# Patient Record
Sex: Female | Born: 1990 | Race: Black or African American | Hispanic: No | Marital: Single | Smoking: Current every day smoker
Health system: Southern US, Community
[De-identification: ages and names within clinical notes are randomized; demographics above are authoritative.]

## PROBLEM LIST (undated history)

## (undated) DIAGNOSIS — F1210 Cannabis abuse, uncomplicated: Secondary | ICD-10-CM

---
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
## 2012-06-27 ENCOUNTER — Emergency Department (HOSPITAL_BASED_OUTPATIENT_CLINIC_OR_DEPARTMENT_OTHER)
Admission: EM | Admit: 2012-06-27 | Discharge: 2012-06-27 | Disposition: A | Discharge status: Home / Self Care | Payer: Self-pay | Attending: Emergency Medicine | Admitting: Emergency Medicine

## 2012-06-27 ENCOUNTER — Encounter (HOSPITAL_BASED_OUTPATIENT_CLINIC_OR_DEPARTMENT_OTHER): Payer: Self-pay | Admitting: Emergency Medicine

## 2012-06-27 DIAGNOSIS — R21 Rash and other nonspecific skin eruption: Secondary | ICD-10-CM | POA: Insufficient documentation

## 2012-06-27 DIAGNOSIS — L240 Irritant contact dermatitis due to detergents: Secondary | ICD-10-CM | POA: Insufficient documentation

## 2012-06-27 MED ORDER — PREDNISONE 20 MG PO TABS: 60.0000 mg | ORAL_TABLET | Freq: Every day | ORAL | Status: DC

## 2012-06-27 MED ORDER — DIPHENHYDRAMINE HCL 25 MG PO CAPS
50.0000 mg | ORAL_CAPSULE | Freq: Once | ORAL | Status: AC
  Administered 2012-06-27: 50 mg via ORAL
  Filled 2012-06-27: qty 2

## 2012-06-27 MED ORDER — PREDNISONE 50 MG PO TABS
60.0000 mg | ORAL_TABLET | Freq: Once | ORAL | Status: AC
  Administered 2012-06-27: 60 mg via ORAL
  Filled 2012-06-27: qty 1

## 2012-06-27 NOTE — ED Notes (Signed)
Pt has generalized fine rash with severe itching.  Pt states it started yesterday.  Pt states she changed laundry detergents.

## 2012-06-27 NOTE — ED Provider Notes (Signed)
History    This chart was scribed for Vinayak Bobier B. Bernette Mayers, MD by Quintella Reichert, ED scribe.  This patient was seen in room MH12/MH12 and the patient's care was started at 8:37 PM.   CSN: [id number]  Arrival date & time 06/27/12  1832       Chief Complaint  Patient presents with  . Pruritis     The history is provided by the patient. No language interpreter was used.    HPI Comments: Megan George is a 22 y.o. female who presents to the Emergency Department complaining of generalized itchy rash that began yesterday.  Pt states that she initially thought that rash was due to mosquito bites but that they rapidly became more extensive.  She states that the majority are on her back, but many are also on her abdomen, legs and arms.  She notes that she recently changed laundry detergent.  Pt denies SOB, swelling, fever, or any other associated symptoms.  She denies h/o any other medical problems.  No past medical history on file.  No past surgical history on file.  Family History  Problem Relation Age of Onset  . Sudden death Neg Hx   . Heart attack Neg Hx     History  Substance Use Topics  . Smoking status: Never Smoker   . Smokeless tobacco: Not on file  . Alcohol Use: Not on file    OB History   Grav Para Term Preterm Abortions TAB SAB Ect Mult Living                  Review of Systems A complete 10 system review of systems was obtained and all systems are negative except as noted in the HPI and PMH.    Allergies  Review of patient's allergies indicates no known allergies.  Home Medications   Current Outpatient Rx  Name  Route  Sig  Dispense  Refill  . predniSONE (DELTASONE) 20 MG tablet   Oral   Take 3 tablets (60 mg total) by mouth daily.   15 tablet   0     BP 132/89  Pulse 78  Temp(Src) 98.2 F (36.8 C) (Oral)  Resp 16  Ht 5\' 6"  (1.676 m)  Wt 140 lb (63.504 kg)  BMI 22.61 kg/m2  SpO2 100%  LMP 06/08/2012  Physical Exam  Nursing note and  vitals reviewed. Constitutional: She is oriented to person, place, and time. She appears well-developed and well-nourished.  HENT:  Head: Normocephalic and atraumatic.  Eyes: EOM are normal. Pupils are equal, round, and reactive to light.  Neck: Normal range of motion. Neck supple.  Cardiovascular: Normal rate, normal heart sounds and intact distal pulses.   No murmur heard. Pulmonary/Chest: Effort normal and breath sounds normal. No stridor. She has no wheezes. She has no rales.  Abdominal: Bowel sounds are normal. She exhibits no distension. There is no tenderness.  Musculoskeletal: Normal range of motion. She exhibits no edema and no tenderness.  Neurological: She is alert and oriented to person, place, and time. She has normal strength. No cranial nerve deficit or sensory deficit.  Skin: Skin is warm and dry. Rash (Diffuse pruritic rash to arms, legs, and trunk) noted.  Psychiatric: She has a normal mood and affect.    ED Course  Procedures (including critical care time)  DIAGNOSTIC STUDIES: Oxygen Saturation is 100% on room air, normal by my interpretation.    COORDINATION OF CARE: 8:40 PM-Informed pt that rash is likely contact dermatitis caused  by detergent.  Discussed home treatment plan which includes oral prednisone with pt at bedside and pt agreed to plan.      Labs Reviewed - No data to display No results found.   1. Contact dermatitis and eczema due to detergents       MDM  Pt with extensive contact dermatitis, given oral prednisone. Benadryl for itching.       I personally performed the services described in this documentation, which was scribed in my presence. The recorded information has been reviewed and is accurate.     Tylee Yum B. Bernette Mayers, MD 06/27/12 [phone]

## 2012-09-11 ENCOUNTER — Ambulatory Visit: Payer: Self-pay | Admitting: Family Medicine

## 2012-12-07 ENCOUNTER — Encounter (HOSPITAL_BASED_OUTPATIENT_CLINIC_OR_DEPARTMENT_OTHER): Payer: Self-pay | Admitting: Emergency Medicine

## 2012-12-07 ENCOUNTER — Emergency Department (HOSPITAL_BASED_OUTPATIENT_CLINIC_OR_DEPARTMENT_OTHER)
Admission: EM | Admit: 2012-12-07 | Discharge: 2012-12-07 | Disposition: A | Discharge status: Home / Self Care | Payer: Self-pay | Attending: Emergency Medicine | Admitting: Emergency Medicine

## 2012-12-07 DIAGNOSIS — K1370 Unspecified lesions of oral mucosa: Secondary | ICD-10-CM | POA: Insufficient documentation

## 2012-12-07 DIAGNOSIS — IMO0002 Reserved for concepts with insufficient information to code with codable children: Secondary | ICD-10-CM | POA: Insufficient documentation

## 2012-12-07 DIAGNOSIS — F17200 Nicotine dependence, unspecified, uncomplicated: Secondary | ICD-10-CM | POA: Insufficient documentation

## 2012-12-07 DIAGNOSIS — K121 Other forms of stomatitis: Secondary | ICD-10-CM

## 2012-12-07 DIAGNOSIS — Z79899 Other long term (current) drug therapy: Secondary | ICD-10-CM | POA: Insufficient documentation

## 2012-12-07 MED ORDER — ACYCLOVIR 400 MG PO TABS: 400.0000 mg | ORAL_TABLET | Freq: Four times a day (QID) | ORAL | Status: DC

## 2012-12-07 NOTE — ED Provider Notes (Signed)
CSN: [id number]     Arrival date & time 12/07/12  1022 History   First MD Initiated Contact with Patient 12/07/12 1127     Chief Complaint  Patient presents with  . bump to chin    (Consider location/radiation/quality/duration/timing/severity/associated sxs/prior Treatment) Patient is a 22 y.o. female presenting with mouth injury. The history is provided by the patient. No language interpreter was used.  Mouth Injury This is a new problem. The current episode started today. The problem occurs constantly. The problem has been unchanged. Nothing aggravates the symptoms. She has tried nothing for the symptoms. The treatment provided no relief.  Pt complains of a ulcer to lip and a lymph node under  History reviewed. No pertinent past medical history. History reviewed. No pertinent past surgical history. Family History  Problem Relation Age of Onset  . Sudden death Neg Hx   . Heart attack Neg Hx    History  Substance Use Topics  . Smoking status: Current Some Day Smoker  . Smokeless tobacco: Not on file  . Alcohol Use: Not on file   OB History   Grav Para Term Preterm Abortions TAB SAB Ect Mult Living                 Review of Systems  Skin: Positive for wound.  All other systems reviewed and are negative.    Allergies  Review of patient's allergies indicates no known allergies.  Home Medications   Current Outpatient Rx  Name  Route  Sig  Dispense  Refill  . acyclovir (ZOVIRAX) 400 MG tablet   Oral   Take 1 tablet (400 mg total) by mouth 4 (four) times daily.   50 tablet   0   . predniSONE (DELTASONE) 20 MG tablet   Oral   Take 3 tablets (60 mg total) by mouth daily.   15 tablet   0    BP 135/73  Pulse 77  Temp(Src) 99.3 F (37.4 C) (Oral)  Resp 16  Ht 5\' 6"  (1.676 m)  Wt 140 lb (63.504 kg)  BMI 22.61 kg/m2  SpO2 100%  LMP 12/07/2012 Physical Exam  Nursing note and vitals reviewed. Constitutional: She is oriented to person, place, and time. She  appears well-developed and well-nourished.  HENT:  Head: Normocephalic and atraumatic.  Ulcer lower outer lip  Cardiovascular: Normal rate.   Pulmonary/Chest: Effort normal.  Neurological: She is alert and oriented to person, place, and time. She has normal reflexes.  Skin: Skin is warm.  Psychiatric: She has a normal mood and affect.    ED Course  Procedures (including critical care time) Labs Review Labs Reviewed - No data to display Imaging Review No results found.  EKG Interpretation   None       MDM   1. Oral ulcer    acyclovir    Elson Areas, PA-C 12/07/12 1152

## 2012-12-07 NOTE — ED Notes (Signed)
Pt amb to room 6 with quick steady gait in nad. Pt reports fever blister onset Friday, had some "tingling" to her chin, and today a "bump came on". No redness noted.

## 2012-12-09 NOTE — ED Provider Notes (Signed)
History/physical exam/procedure(s) were performed by non-physician practitioner and as supervising physician I was immediately available for consultation/collaboration. I have reviewed all notes and am in agreement with care and plan.   Mafalda Mcginniss S Dacoda Finlay, MD 12/09/12 1506 

## 2013-11-24 ENCOUNTER — Ambulatory Visit (INDEPENDENT_AMBULATORY_CARE_PROVIDER_SITE_OTHER): Payer: Self-pay | Admitting: Family Medicine

## 2013-11-24 ENCOUNTER — Encounter: Payer: Self-pay | Admitting: Family Medicine

## 2013-11-24 VITALS — BP 122/84 | HR 93 | Ht 65.0 in | Wt 133.2 lb

## 2013-11-24 DIAGNOSIS — Z025 Encounter for examination for participation in sport: Secondary | ICD-10-CM

## 2013-11-24 NOTE — Progress Notes (Signed)
Patient ID: Megan George, female   DOB: 05/18/1990, 23 y.o.   MRN: [id number]  Patient is a [age] y.o. year old female here for sports physical.  Patient plans to play basketball. Reports no current complaints. Denies chest pain, shortness of breath, passing out with exercise. No medical problems. No family history of heart disease or sudden death before age 21.  Vision 20/25 right, 20/20 left without correction Blood pressure normal for age and height  History of irregular heartbeat - had EKG that was normal back in 2011 - no recurrence and not been on any medicines.  History of 3 concussions but no sequelae - last one 4 years ago. History of ankle sprains but denies any injuries since last sports physical here 2 years ago.  History reviewed. No pertinent past medical history.  No current outpatient prescriptions on file prior to visit.   No current facility-administered medications on file prior to visit.    History reviewed. No pertinent past surgical history.  No Known Allergies  History   Social History  . Marital Status: Single    Spouse Name: N/A    Number of Children: N/A  . Years of Education: N/A   Occupational History  . Not on file.   Social History Main Topics  . Smoking status: Current Some Day Smoker  . Smokeless tobacco: Not on file  . Alcohol Use: Not on file  . Drug Use: Not on file  . Sexual Activity: Not on file   Other Topics Concern  . Not on file   Social History Narrative  . No narrative on file    Family History  Problem Relation Age of Onset  . Sudden death Neg Hx   . Heart attack Neg Hx     BP 122/84  Pulse 93  Ht 5\' 5"  (1.651 m)  Wt 133 lb 3.2 oz (60.419 kg)  BMI 22.17 kg/m2  Review of Systems: See HPI above.  Physical Exam: Gen: NAD CV: RRR no MRG Lungs: CTAB MSK: FROM and strength all joints and muscle groups.  No evidence scoliosis.  Assessment/Plan: 1. Sports physical: Cleared for all sports without restrictions.

## 2013-11-24 NOTE — Assessment & Plan Note (Signed)
Cleared for all sports without restrictions. 

## 2016-06-21 ENCOUNTER — Encounter (HOSPITAL_BASED_OUTPATIENT_CLINIC_OR_DEPARTMENT_OTHER): Payer: Self-pay | Admitting: *Deleted

## 2016-06-21 ENCOUNTER — Emergency Department (HOSPITAL_BASED_OUTPATIENT_CLINIC_OR_DEPARTMENT_OTHER)
Admission: EM | Admit: 2016-06-21 | Discharge: 2016-06-21 | Disposition: A | Discharge status: Home / Self Care | Payer: Self-pay | Attending: Emergency Medicine | Admitting: Emergency Medicine

## 2016-06-21 DIAGNOSIS — F17200 Nicotine dependence, unspecified, uncomplicated: Secondary | ICD-10-CM | POA: Insufficient documentation

## 2016-06-21 DIAGNOSIS — X158XXA Contact with other hot household appliances, initial encounter: Secondary | ICD-10-CM | POA: Insufficient documentation

## 2016-06-21 DIAGNOSIS — T22232A Burn of second degree of left upper arm, initial encounter: Secondary | ICD-10-CM | POA: Insufficient documentation

## 2016-06-21 DIAGNOSIS — Y990 Civilian activity done for income or pay: Secondary | ICD-10-CM | POA: Insufficient documentation

## 2016-06-21 DIAGNOSIS — Y929 Unspecified place or not applicable: Secondary | ICD-10-CM | POA: Insufficient documentation

## 2016-06-21 DIAGNOSIS — Y9389 Activity, other specified: Secondary | ICD-10-CM | POA: Insufficient documentation

## 2016-06-21 MED ORDER — SILVER SULFADIAZINE 1 % EX CREA: 1.0000 "application " | TOPICAL_CREAM | Freq: Every day | CUTANEOUS | 0 refills | Status: DC

## 2016-06-21 MED ORDER — SILVER SULFADIAZINE 1 % EX CREA
TOPICAL_CREAM | CUTANEOUS | Status: AC
  Filled 2016-06-21: qty 85

## 2016-06-21 NOTE — ED Notes (Signed)
ED Provider at bedside. 

## 2016-06-21 NOTE — Discharge Instructions (Signed)
Apply Silvadene cream daily. Change dressing daily. Follow up with primary care for reevaluation in one week. Return to the ED if any concerning symptoms or worsening develops.

## 2016-06-21 NOTE — ED Provider Notes (Signed)
[facility]-EMERGENCY DEPT Provider Note   CSN: [id number] Arrival date & time: 06/21/16  2014   By signing my name below, I, Soijett Blue, attest that this documentation has been prepared under the direction and in the presence of Michela PitcherMina Alexandro Line, PA-C Electronically Signed: Soijett Blue, ED Scribe. 06/21/16. 8:39 PM.  History   Chief Complaint Chief Complaint  Patient presents with  . Burn    HPI Megan George is a 26 y.o. female who presents to the Emergency Department complaining of left upper arm burn onset 3:30 PM today. Pt reports that she was at work at [employer] when she accidentally leaned against a quesadilla cooker for 2-3 seconds. Pt notes that she took a shower PTA and noted blisters to the affected area following the use of Jergens lotion. She has not tried any medications for the relief of her symptoms. She states the area is not painful. She denies numbness, tingling, weakness, CP, SOB, abdominal pain, nausea, vomiting, and any other symptoms.     The history is provided by the patient. No language interpreter was used.    History reviewed. No pertinent past medical history.  Patient Active Problem List   Diagnosis Date Noted  . Sports physical 08/29/2011  . FOOT PAIN, RIGHT 01/30/2010    History reviewed. No pertinent surgical history.  OB History    No data available       Home Medications    Prior to Admission medications   Medication Sig Start Date End Date Taking? Authorizing Provider  silver sulfADIAZINE (SILVADENE) 1 % cream Apply 1 application topically daily. 06/21/16   Jeanie SewerFawze, Lanitra Battaglini A, PA-C    Family History Family History  Problem Relation Age of Onset  . Sudden death Neg Hx   . Heart attack Neg Hx     Social History Social History  Substance Use Topics  . Smoking status: Current Some Day Smoker    Packs/day: 0.50  . Smokeless tobacco: Not on file  . Alcohol use No     Allergies   Patient has no known allergies.   Review of Systems Review  of Systems  Respiratory: Negative for shortness of breath.   Cardiovascular: Negative for chest pain.  Gastrointestinal: Negative for abdominal pain, nausea and vomiting.  Skin:       +Left upper arm burn  Neurological: Negative for weakness and numbness.       No tingling     Physical Exam Updated Vital Signs BP 131/84   Pulse 81   Temp 98.4 F (36.9 C)   Resp 16   Ht 5\' 6"  (1.676 m)   Wt 120 lb (54.4 kg)   LMP 05/29/2016   SpO2 100%   BMI 19.37 kg/m   Physical Exam  Constitutional: She is oriented to person, place, and time. She appears well-developed and well-nourished. No distress.  HENT:  Head: Normocephalic and atraumatic.  Eyes: EOM are normal.  Neck: Neck supple.  Cardiovascular: Normal rate.   Pulses:      Radial pulses are 2+ on the right side, and 2+ on the left side.  Pulmonary/Chest: Effort normal. No respiratory distress.  Abdominal: She exhibits no distension.  Musculoskeletal: Normal range of motion.  Neurological: She is alert and oriented to person, place, and time.  Fluent speech, no facial droop, sensation intact globally.   Skin: Skin is warm and dry. Burn noted.  4 cm x 5 cm square area of burn with superficial skin layers removed post debridement by nurse to the  lateral aspect of mid left upper arm. no blistering or bleeding. Non-TTP. The inferior portion of the burn is is pink with superior area slightly more paler in appearance. Overlying tattoo still clearly visible. No visible subcutaneous fat or deep tissues/structures.    Psychiatric: She has a normal mood and affect. Her behavior is normal.  Nursing note and vitals reviewed.    ED Treatments / Results  DIAGNOSTIC STUDIES: Oxygen Saturation is 100% on RA, nl by my interpretation.    COORDINATION OF CARE: 8:35 PM Discussed treatment plan with pt at bedside which includes consult with attending, and pt agreed to plan.   Labs (all labs ordered are listed, but only abnormal results are  displayed) Labs Reviewed - No data to display  EKG  EKG Interpretation None       Radiology No results found.  Procedures Procedures (including critical care time)  Medications Ordered in ED Medications - No data to display   Initial Impression / Assessment and Plan / ED Course  I have reviewed the triage vital signs and the nursing notes.  Pertinent labs & imaging results that were available during my care of the patient were reviewed by me and considered in my medical decision making (see chart for details).     Patient with nontender partial-thickness burn of the left upper arm. Patient afebrile, vital signs are stable, sensation is intact. No evidence of extension into the deep structures of the arm. Burn has been debrided by nurse with application of Silvadene cream and dressing. Discussed proper wound care, recommend follow-up with primary care this week for reevaluation. Patient will be discharged with Silvadene cream and dressing. Instructed to follow-up with burn clinic if any concerning symptoms develop, or return to the ED. Patient is in no apparent distress at time of discharge and verbalizes understanding of and agreement with plan.   Final Clinical Impressions(s) / ED Diagnoses   Final diagnoses:  Partial thickness burn of left upper arm, initial encounter    New Prescriptions Discharge Medication List as of 06/21/2016  9:23 PM    START taking these medications   Details  silver sulfADIAZINE (SILVADENE) 1 % cream Apply 1 application topically daily., Starting Fri 06/21/2016, Print       I personally performed the services described in this documentation, which was scribed in my presence. The recorded information has been reviewed and is accurate.     Jeanie Sewer, PA-C 06/22/16 1720    Nira Conn, MD 06/25/16 [phone]

## 2016-06-21 NOTE — ED Notes (Signed)
Burn dsg to left upper arm

## 2016-06-21 NOTE — ED Notes (Signed)
Burn debrided blister removed nickel size with area to center of burn with pink skin surrounding

## 2016-06-23 ENCOUNTER — Encounter (HOSPITAL_BASED_OUTPATIENT_CLINIC_OR_DEPARTMENT_OTHER): Payer: Self-pay | Admitting: Emergency Medicine

## 2016-06-23 ENCOUNTER — Emergency Department (HOSPITAL_BASED_OUTPATIENT_CLINIC_OR_DEPARTMENT_OTHER)
Admission: EM | Admit: 2016-06-23 | Discharge: 2016-06-23 | Disposition: A | Discharge status: Home / Self Care | Payer: Self-pay | Attending: Emergency Medicine | Admitting: Emergency Medicine

## 2016-06-23 DIAGNOSIS — X19XXXD Contact with other heat and hot substances, subsequent encounter: Secondary | ICD-10-CM | POA: Insufficient documentation

## 2016-06-23 DIAGNOSIS — M79602 Pain in left arm: Secondary | ICD-10-CM

## 2016-06-23 DIAGNOSIS — M7989 Other specified soft tissue disorders: Secondary | ICD-10-CM

## 2016-06-23 DIAGNOSIS — F17200 Nicotine dependence, unspecified, uncomplicated: Secondary | ICD-10-CM | POA: Insufficient documentation

## 2016-06-23 DIAGNOSIS — T22232D Burn of second degree of left upper arm, subsequent encounter: Secondary | ICD-10-CM | POA: Insufficient documentation

## 2016-06-23 MED ORDER — CEPHALEXIN 500 MG PO CAPS
500.0000 mg | ORAL_CAPSULE | Freq: Two times a day (BID) | ORAL | 0 refills | Status: DC
Start: 2016-06-23 — End: 2018-04-21

## 2016-06-23 NOTE — ED Notes (Signed)
ED Provider at bedside. 

## 2016-06-23 NOTE — ED Triage Notes (Signed)
Burn to left upper arm on Friday at work. Was eval here for same on Friday. Has developed swelling to left lower arm. CMS intact, tissue pink to burn area
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
## 2016-06-23 NOTE — Discharge Instructions (Signed)
As we discussed burns can typically have some surrounding redness and swelling. We will give you an antibiotic in case the symptoms get worse. You can start it today or tomorrow if you choose.  Continue keeping the burn clean and dry. Continue applying sterile dressing and Silvadene cream.  You can take Tylenol or ibuprofen as needed for the pain.  Follow-up with the primary care doctor systems below.  Return the emergency Department for any fever, worsening swelling or redness to the arm, drainage from the burn, any other worsening or concerning symptoms.

## 2017-10-06 ENCOUNTER — Encounter (HOSPITAL_BASED_OUTPATIENT_CLINIC_OR_DEPARTMENT_OTHER): Payer: Self-pay | Admitting: *Deleted

## 2017-10-06 ENCOUNTER — Emergency Department (HOSPITAL_BASED_OUTPATIENT_CLINIC_OR_DEPARTMENT_OTHER)
Admission: EM | Admit: 2017-10-06 | Discharge: 2017-10-06 | Disposition: A | Discharge status: Home / Self Care | Payer: 59 | Attending: Emergency Medicine | Admitting: Emergency Medicine

## 2017-10-06 ENCOUNTER — Other Ambulatory Visit: Payer: Self-pay

## 2017-10-06 DIAGNOSIS — B360 Pityriasis versicolor: Secondary | ICD-10-CM | POA: Insufficient documentation

## 2017-10-06 DIAGNOSIS — F17200 Nicotine dependence, unspecified, uncomplicated: Secondary | ICD-10-CM | POA: Diagnosis not present

## 2017-10-06 DIAGNOSIS — R21 Rash and other nonspecific skin eruption: Secondary | ICD-10-CM | POA: Diagnosis present

## 2017-10-06 MED ORDER — SELENIUM SULFIDE 2.5 % EX LOTN: 1.0000 "application " | TOPICAL_LOTION | Freq: Every day | CUTANEOUS | 12 refills | Status: DC | PRN

## 2017-10-06 MED ORDER — TERBINAFINE HCL 1 % EX CREA: 1.0000 "application " | TOPICAL_CREAM | Freq: Two times a day (BID) | CUTANEOUS | 0 refills | Status: DC

## 2017-10-06 NOTE — ED Triage Notes (Signed)
Rash on her arms, neck, chest and abdomen. She has had the same rash on and off for a year.

## 2017-10-06 NOTE — ED Provider Notes (Signed)
[facility] EMERGENCY DEPARTMENT Provider Note   CSN: [id number] Arrival date & time: 10/06/17  1948     History   Chief Complaint Chief Complaint  Patient presents with  . Rash    HPI Megan George is a 27 y.o. female.  HPI Megan George is a 27 y.o. female dents emergency department complaint of a rash to bilateral arms, chest, abdomen, neck for about a month.  States this rash has been there on and off for a year.  She states is very itchy especially on her neck.  She has not tried any medicine prior to coming here.  She states rash is spreading.  No one in close contact has similar rash.  No other complaints.  No fever or chills.  No concern for STD.  No history of the same.  No fever, chills, malaise.  History reviewed. No pertinent past medical history.  Patient Active Problem List   Diagnosis Date Noted  . Sports physical 08/29/2011  . FOOT PAIN, RIGHT 01/30/2010    History reviewed. No pertinent surgical history.   OB History   None      Home Medications    Prior to Admission medications   Medication Sig Start Date End Date Taking? Authorizing Provider  cephALEXin (KEFLEX) 500 MG capsule Take 1 capsule (500 mg total) by mouth 2 (two) times daily. 06/23/16   Maxwell Caul, PA-C  silver sulfADIAZINE (SILVADENE) 1 % cream Apply 1 application topically daily. 06/21/16   Jeanie Sewer, PA-C    Family History Family History  Problem Relation Age of Onset  . Sudden death Neg Hx   . Heart attack Neg Hx     Social History Social History   Tobacco Use  . Smoking status: Current Some Day Smoker    Packs/day: 0.50  . Smokeless tobacco: Never Used  Substance Use Topics  . Alcohol use: No  . Drug use: Yes    Types: Marijuana     Allergies   Patient has no known allergies.   Review of Systems Review of Systems  Constitutional: Negative for chills and fever.  Respiratory: Negative for cough, chest tightness and shortness of breath.     Cardiovascular: Negative for chest pain, palpitations and leg swelling.  Musculoskeletal: Negative for arthralgias, myalgias, neck pain and neck stiffness.  Skin: Positive for color change and rash.  Neurological: Negative for dizziness, weakness and headaches.  All other systems reviewed and are negative.    Physical Exam Updated Vital Signs BP 134/84 (BP Location: Left Arm)   Pulse 68   Temp 99.1 F (37.3 C) (Oral)   Resp 16   Ht 5\' 6"  (1.676 m)   Wt 60.3 kg   LMP 09/29/2017   SpO2 98%   BMI 21.46 kg/m   Physical Exam  Constitutional: She appears well-developed and well-nourished. No distress.  HENT:  Head: Normocephalic.  Eyes: Conjunctivae are normal.  Neck: Neck supple.  Cardiovascular: Normal rate, regular rhythm and normal heart sounds.  Pulmonary/Chest: Effort normal and breath sounds normal. No respiratory distress. She has no wheezes. She has no rales.  Musculoskeletal: She exhibits no edema.  Neurological: She is alert.  Skin: Skin is warm and dry.  Scaly patches of hypopigmentation to bilateral antecubital fossa and neck, there are patches of hyperpigmentation to the abdomen and chest.  Psychiatric: She has a normal mood and affect. Her behavior is normal.  Nursing note and vitals reviewed.    ED Treatments / Results  Labs (all labs ordered are listed, but only abnormal results are displayed) Labs Reviewed - No data to display  EKG None  Radiology No results found.  Procedures Procedures (including critical care time)  Medications Ordered in ED Medications - No data to display   Initial Impression / Assessment and Plan / ED Course  I have reviewed the triage vital signs and the nursing notes.  Pertinent labs & imaging results that were available during my care of the patient were reviewed by me and considered in my medical decision making (see chart for details).    Patient in the emergency department with diffuse rash all over her arms,  chest, abdomen, neck.  Rash is itchy.  It is consistent with tinea versicolor.  We will treat with selenium sulfide lotion and antifungals.  Follow-up as needed.  Vitals:   10/06/17 2007 10/06/17 2010 10/06/17 2012  BP: 134/84    Pulse: 68    Resp: 16    Temp: 99.1 F (37.3 C)    TempSrc: Oral    SpO2: 98%    Weight:   60.3 kg  Height:  5\' 6"  (1.676 m)      Final Clinical Impressions(s) / ED Diagnoses   Final diagnoses:  Tinea versicolor    ED Discharge Orders    None       Jaynie CrumbleKirichenko, Rhodie Cienfuegos, PA-C 10/06/17 2314    Vanetta MuldersZackowski, Scott, MD 10/12/17 [phone]

## 2017-10-06 NOTE — Discharge Instructions (Addendum)
Use selenium sulfite shampoo and Lamisil cream daily for at least 2 to 3 weeks or until cleared.  Follow-up with your family doctor as needed.

## 2018-02-20 ENCOUNTER — Encounter (HOSPITAL_BASED_OUTPATIENT_CLINIC_OR_DEPARTMENT_OTHER): Payer: Self-pay | Admitting: *Deleted

## 2018-02-20 ENCOUNTER — Emergency Department (HOSPITAL_BASED_OUTPATIENT_CLINIC_OR_DEPARTMENT_OTHER)
Admission: EM | Admit: 2018-02-20 | Discharge: 2018-02-20 | Disposition: A | Discharge status: Home / Self Care | Payer: 59 | Attending: Emergency Medicine | Admitting: Emergency Medicine

## 2018-02-20 ENCOUNTER — Other Ambulatory Visit: Payer: Self-pay

## 2018-02-20 DIAGNOSIS — J111 Influenza due to unidentified influenza virus with other respiratory manifestations: Secondary | ICD-10-CM | POA: Insufficient documentation

## 2018-02-20 DIAGNOSIS — F17200 Nicotine dependence, unspecified, uncomplicated: Secondary | ICD-10-CM | POA: Insufficient documentation

## 2018-02-20 DIAGNOSIS — R69 Illness, unspecified: Secondary | ICD-10-CM

## 2018-02-20 DIAGNOSIS — Z79899 Other long term (current) drug therapy: Secondary | ICD-10-CM | POA: Insufficient documentation

## 2018-02-20 LAB — GROUP A STREP BY PCR: GROUP A STREP BY PCR: NOT DETECTED

## 2018-02-20 MED ORDER — OSELTAMIVIR PHOSPHATE 75 MG PO CAPS: 75.0000 mg | ORAL_CAPSULE | Freq: Two times a day (BID) | ORAL | 0 refills | Status: AC

## 2018-02-20 NOTE — ED Triage Notes (Signed)
2 days of cold symptoms. Head pain, facial pain, and cough.

## 2018-02-20 NOTE — ED Provider Notes (Signed)
[facility] EMERGENCY DEPARTMENT Provider Note   CSN: [id number] Arrival date & time: 02/20/18  1341  History   Chief Complaint Influenza symptoms  HPI Megan George is a 28 y.o. female with no significant past medical history who presents for evaluation of flulike symptoms.  Patient states she has had congestion, rhinorrhea, sore throat, nonproductive cough as well as body aches and pains and subjective fever.  Symptom onset 2 days ago.  Has not take anything for symptoms PTA.  Did not receive influenza vaccine.  No aggravating or alleviating factors.  Denies chills, nausea, vomiting, chest pain, shortness of breath, diarrhea, constipation, dysuria.  Patient states she has had a sore throat.  She rates his pain a 4/10.  Pain does not radiate.  Able to tolerate p.o. intake without difficulty.  Denies drooling, dysphasia or voice changes.  History provided by patient.  No interpreter was used.  HPI  History reviewed. No pertinent past medical history.  Patient Active Problem List   Diagnosis Date Noted  . Sports physical 08/29/2011  . FOOT PAIN, RIGHT 01/30/2010    History reviewed. No pertinent surgical history.   OB History   No obstetric history on file.      Home Medications    Prior to Admission medications   Medication Sig Start Date End Date Taking? Authorizing Provider  cephALEXin (KEFLEX) 500 MG capsule Take 1 capsule (500 mg total) by mouth 2 (two) times daily. 06/23/16   Maxwell CaulLayden, Lindsey A, PA-C  oseltamivir (TAMIFLU) 75 MG capsule Take 1 capsule (75 mg total) by mouth 2 (two) times daily for 5 days. 02/20/18 02/25/18  Geovannie Vilar A, PA-C  selenium sulfide (SELSUN) 2.5 % shampoo Apply 1 application topically daily as needed for irritation. 10/06/17   Kirichenko, Lemont Fillersatyana, PA-C  silver sulfADIAZINE (SILVADENE) 1 % cream Apply 1 application topically daily. 06/21/16   Fawze, Mina A, PA-C  terbinafine (LAMISIL AT) 1 % cream Apply 1 application topically 2 (two)  times daily. 10/06/17   Jaynie CrumbleKirichenko, Tatyana, PA-C    Family History Family History  Problem Relation Age of Onset  . Sudden death Neg Hx   . Heart attack Neg Hx     Social History Social History   Tobacco Use  . Smoking status: Current Some Day Smoker    Packs/day: 0.50  . Smokeless tobacco: Never Used  Substance Use Topics  . Alcohol use: No  . Drug use: Yes    Types: Marijuana     Allergies   Patient has no known allergies.   Review of Systems Review of Systems  HENT: Positive for congestion, postnasal drip, rhinorrhea and sore throat. Negative for ear discharge, ear pain, hearing loss, mouth sores, nosebleeds, sinus pressure, sinus pain, sneezing, tinnitus, trouble swallowing and voice change.   Respiratory: Positive for cough. Negative for choking, shortness of breath, wheezing and stridor.   Cardiovascular: Negative.   Gastrointestinal: Negative.   Genitourinary: Negative.   Musculoskeletal: Negative.   Skin: Negative.   Neurological: Negative.   All other systems reviewed and are negative.    Physical Exam Updated Vital Signs BP (!) 117/91   Pulse 63   Temp 98.2 F (36.8 C) (Oral)   Resp 16   Ht 5\' 6"  (1.676 m)   Wt 59 kg   LMP 02/13/2018   SpO2 100%   BMI 20.98 kg/m   Physical Exam Vitals signs and nursing note reviewed.  Constitutional:      General: She is not in acute distress.  Appearance: She is well-developed. She is not ill-appearing, toxic-appearing or diaphoretic.  HENT:     Head: Normocephalic and atraumatic.     Jaw: There is normal jaw occlusion. No trismus, tenderness or swelling.     Right Ear: Tympanic membrane, ear canal and external ear normal. No drainage or swelling. There is no impacted cerumen. Tympanic membrane is not scarred, perforated, erythematous, retracted or bulging.     Left Ear: Tympanic membrane, ear canal and external ear normal. No drainage or swelling. There is no impacted cerumen. Tympanic membrane is not  scarred, perforated, erythematous, retracted or bulging.     Nose: Congestion and rhinorrhea present.     Right Sinus: No maxillary sinus tenderness or frontal sinus tenderness.     Left Sinus: No maxillary sinus tenderness or frontal sinus tenderness.     Mouth/Throat:     Lips: Pink.     Mouth: Mucous membranes are moist.     Pharynx: Oropharynx is clear. Uvula midline.     Tonsils: No tonsillar exudate or tonsillar abscesses. Swelling: 1+ on the right. 1+ on the left.     Comments: Posterior oropharynx with erythema without exudate.  Tonsils 1+ bilaterally without exudate.  Uvula midline without deviation.  No drooling, dysphasia or phonation changes.  No evidence of PTA, RPA. Eyes:     Pupils: Pupils are equal, round, and reactive to light.  Neck:     Musculoskeletal: Normal range of motion.     Comments: No stiffness or rigidity. Cardiovascular:     Rate and Rhythm: Normal rate.     Pulses: Normal pulses.     Heart sounds: Normal heart sounds.  Pulmonary:     Effort: Pulmonary effort is normal. No respiratory distress.     Breath sounds: Normal breath sounds and air entry.     Comments: Clear to auscultation bilaterally without wheeze, rhonchi or rales. Abdominal:     General: There is no distension.     Comments: Soft, nontender without rebound or guarding.  Musculoskeletal: Normal range of motion.     Comments: Moves all extremities without difficulty.  Skin:    General: Skin is warm and dry.     Comments: No rashes or lesions  Neurological:     Mental Status: She is alert.      ED Treatments / Results  Labs (all labs ordered are listed, but only abnormal results are displayed) Labs Reviewed  GROUP A STREP BY PCR    EKG None  Radiology No results found.  Procedures Procedures (including critical care time)  Medications Ordered in ED Medications - No data to display   Initial Impression / Assessment and Plan / ED Course  I have reviewed the triage vital  signs and the nursing notes.  Pertinent labs & imaging results that were available during my care of the patient were reviewed by me and considered in my medical decision making (see chart for details).  28 year old female presents for influenza-like symptoms.  Afebrile, nonseptic, non-ill-appearing.  Did not receive influenza vaccine.  Tonsils 1+ bilaterally without exudate.  No uvula deviation.  Able to tolerate p.o. intake in department without difficulty.  Strep negative. Patient with symptoms consistent with influenza.  Vitals are stable.  No signs of dehydration, tolerating PO's.  Lungs are clear. Due to patient's presentation and physical exam a chest x-ray was not ordered bc likely diagnosis of flu.  Discussed the cost versus benefit of Tamiflu treatment with the patient.  Patient  requesting Tamiflu prescription. Patient will be discharged with instructions to orally hydrate, rest, and use over-the-counter medications such as anti-inflammatories ibuprofen and Aleve for muscle aches and Tylenol for fever.  Patient is hemodynamically stable and appropriate for DC home at this time.  I discussed return precautions.  Patient voiced understanding and is agreeable for follow-up.   Final Clinical Impressions(s) / ED Diagnoses   Final diagnoses:  Influenza-like illness    ED Discharge Orders         Ordered    oseltamivir (TAMIFLU) 75 MG capsule  2 times daily     02/20/18 1525           Zitlaly Malson A, PA-C 02/20/18 1553    Vanetta Mulders, MD 03/03/18 1439

## 2018-02-20 NOTE — Discharge Instructions (Addendum)
Evaluated today for flulike symptoms.  Your strep test is negative.  I prescribed you Tamiflu.  If you develop nausea, vomiting, abdominal pain, severe headache please stop taking Tamiflu.  Follow-up with PCP for reevaluation if you continue to have symptoms about 1 week.  Return to the ED for any worsening symptoms.

## 2018-04-21 ENCOUNTER — Other Ambulatory Visit: Payer: Self-pay

## 2018-04-21 ENCOUNTER — Encounter (HOSPITAL_BASED_OUTPATIENT_CLINIC_OR_DEPARTMENT_OTHER): Payer: Self-pay | Admitting: Emergency Medicine

## 2018-04-21 ENCOUNTER — Emergency Department (HOSPITAL_BASED_OUTPATIENT_CLINIC_OR_DEPARTMENT_OTHER)
Admission: EM | Admit: 2018-04-21 | Discharge: 2018-04-21 | Disposition: A | Discharge status: Home / Self Care | Payer: Self-pay | Attending: Emergency Medicine | Admitting: Emergency Medicine

## 2018-04-21 DIAGNOSIS — F1210 Cannabis abuse, uncomplicated: Secondary | ICD-10-CM | POA: Insufficient documentation

## 2018-04-21 DIAGNOSIS — R319 Hematuria, unspecified: Secondary | ICD-10-CM | POA: Insufficient documentation

## 2018-04-21 DIAGNOSIS — F17210 Nicotine dependence, cigarettes, uncomplicated: Secondary | ICD-10-CM | POA: Insufficient documentation

## 2018-04-21 DIAGNOSIS — R350 Frequency of micturition: Secondary | ICD-10-CM | POA: Insufficient documentation

## 2018-04-21 DIAGNOSIS — N390 Urinary tract infection, site not specified: Secondary | ICD-10-CM | POA: Insufficient documentation

## 2018-04-21 LAB — URINALYSIS, ROUTINE W REFLEX MICROSCOPIC
GLUCOSE, UA: NEGATIVE mg/dL
Ketones, ur: 15 mg/dL — AB
NITRITE: POSITIVE — AB
PROTEIN: 100 mg/dL — AB
Specific Gravity, Urine: 1.025 (ref 1.005–1.030)
pH: 6.5 (ref 5.0–8.0)

## 2018-04-21 LAB — URINALYSIS, MICROSCOPIC (REFLEX)

## 2018-04-21 LAB — PREGNANCY, URINE: PREG TEST UR: NEGATIVE

## 2018-04-21 MED ORDER — CEPHALEXIN 500 MG PO CAPS: 500.0000 mg | ORAL_CAPSULE | Freq: Three times a day (TID) | ORAL | 0 refills | Status: DC

## 2018-04-21 MED ORDER — PHENAZOPYRIDINE HCL 200 MG PO TABS: 200.0000 mg | ORAL_TABLET | Freq: Three times a day (TID) | ORAL | 0 refills | Status: DC

## 2018-04-21 MED FILL — PHENAZOPYRIDINE 200 MG TAB: 200 | 2 days supply | Qty: 6 | Fill #0

## 2018-04-21 MED FILL — CEPHALEXIN 500 MG CAPSULE: 500 | 7 days supply | Qty: 21 | Fill #0

## 2018-04-21 NOTE — ED Provider Notes (Signed)
[facility] EMERGENCY DEPARTMENT Provider Note   CSN: [id number] Arrival date & time: 04/21/18  0941    History   Chief Complaint Chief Complaint  Patient presents with  . Dysuria    HPI Megan George is a 28 y.o. female.  She is complaining of some dysuria for a couple of days.  She has noticed frequency dysuria and starting today she noticed a little bit of blood in the urine when she urinated.  No fevers chills nausea vomiting back pain flank pain.  Last menstrual period was last week.  No vaginal discharge and not sexually active.  She thinks she had a UTI a few years ago but does not remember what she was treated with.     The history is provided by the patient.  Dysuria  Pain quality:  Burning Pain severity:  Moderate Onset quality:  Gradual Timing:  Intermittent Progression:  Unchanged Chronicity:  New Relieved by:  None tried Worsened by:  Nothing Ineffective treatments:  None tried Urinary symptoms: discolored urine, frequent urination and hematuria   Urinary symptoms: no bladder incontinence   Associated symptoms: no abdominal pain, no fever, no flank pain, no nausea, no vaginal discharge and no vomiting   Risk factors: not sexually active and no sexually transmitted infections     History reviewed. No pertinent past medical history.  Patient Active Problem List   Diagnosis Date Noted  . Sports physical 08/29/2011  . FOOT PAIN, RIGHT 01/30/2010    History reviewed. No pertinent surgical history.   OB History   No obstetric history on file.      Home Medications    Prior to Admission medications   Medication Sig Start Date End Date Taking? Authorizing Provider  cephALEXin (KEFLEX) 500 MG capsule Take 1 capsule (500 mg total) by mouth 2 (two) times daily. 06/23/16   Maxwell Caul, PA-C  selenium sulfide (SELSUN) 2.5 % shampoo Apply 1 application topically daily as needed for irritation. 10/06/17   Kirichenko, Lemont Fillers, PA-C  silver  sulfADIAZINE (SILVADENE) 1 % cream Apply 1 application topically daily. 06/21/16   Fawze, Mina A, PA-C  terbinafine (LAMISIL AT) 1 % cream Apply 1 application topically 2 (two) times daily. 10/06/17   Jaynie Crumble, PA-C    Family History Family History  Problem Relation Age of Onset  . Sudden death Neg Hx   . Heart attack Neg Hx     Social History Social History   Tobacco Use  . Smoking status: Current Some Day Smoker    Packs/day: 0.50  . Smokeless tobacco: Never Used  Substance Use Topics  . Alcohol use: No  . Drug use: Yes    Types: Marijuana    Comment: last marijuana last night     Allergies   Patient has no known allergies.   Review of Systems Review of Systems  Constitutional: Negative for fever.  HENT: Negative for sore throat.   Eyes: Negative for visual disturbance.  Respiratory: Negative for shortness of breath.   Cardiovascular: Negative for chest pain.  Gastrointestinal: Negative for abdominal pain, nausea and vomiting.  Genitourinary: Positive for dysuria. Negative for flank pain and vaginal discharge.  Musculoskeletal: Negative for back pain.  Skin: Negative for rash.  Neurological: Negative for headaches.     Physical Exam Updated Vital Signs BP 131/89 (BP Location: Left Arm)   Pulse 82   Temp 98.4 F (36.9 C) (Oral)   Resp 16   Ht 5\' 6"  (1.676 m)   Wt  59.6 kg   LMP 04/12/2018   SpO2 99%   BMI 21.21 kg/m   Physical Exam Constitutional:      Appearance: She is well-developed.  HENT:     Head: Normocephalic and atraumatic.  Eyes:     Conjunctiva/sclera: Conjunctivae normal.  Neck:     Musculoskeletal: Neck supple.  Cardiovascular:     Rate and Rhythm: Normal rate and regular rhythm.     Pulses: Normal pulses.  Pulmonary:     Effort: Pulmonary effort is normal.     Breath sounds: Normal breath sounds.  Abdominal:     General: Abdomen is flat.     Palpations: Abdomen is soft.     Tenderness: There is no abdominal  tenderness. There is no right CVA tenderness or left CVA tenderness.  Musculoskeletal:        General: No tenderness or deformity.  Skin:    General: Skin is warm and dry.  Neurological:     General: No focal deficit present.     Mental Status: She is alert.     GCS: GCS eye subscore is 4. GCS verbal subscore is 5. GCS motor subscore is 6.      ED Treatments / Results  Labs (all labs ordered are listed, but only abnormal results are displayed) Labs Reviewed  URINALYSIS, ROUTINE W REFLEX MICROSCOPIC - Abnormal; Notable for the following components:      Result Value   Color, Urine BROWN (*)    APPearance CLOUDY (*)    Hgb urine dipstick LARGE (*)    Bilirubin Urine MODERATE (*)    Ketones, ur 15 (*)    Protein, ur 100 (*)    Nitrite POSITIVE (*)    Leukocytes,Ua LARGE (*)    All other components within normal limits  URINALYSIS, MICROSCOPIC (REFLEX) - Abnormal; Notable for the following components:   Bacteria, UA MANY (*)    All other components within normal limits  PREGNANCY, URINE    EKG None  Radiology No results found.  Procedures Procedures (including critical care time)  Medications Ordered in ED Medications - No data to display   Initial Impression / Assessment and Plan / ED Course  I have reviewed the triage vital signs and the nursing notes.  Pertinent labs & imaging results that were available during my care of the patient were reviewed by me and considered in my medical decision making (see chart for details).         Final Clinical Impressions(s) / ED Diagnoses   Final diagnoses:  Lower urinary tract infectious disease    ED Discharge Orders         Ordered    cephALEXin (KEFLEX) 500 MG capsule  3 times daily     04/21/18 1027    phenazopyridine (PYRIDIUM) 200 MG tablet  3 times daily     04/21/18 1027           Terrilee Files, MD 04/21/18 1815

## 2018-04-21 NOTE — Discharge Instructions (Addendum)
You were evaluated in the emergency department for urinary symptoms and some blood in your urine.  We are treating you for urinary tract infection.  You should finish all your prescription of your antibiotic.  Please drink plenty of fluids.  Follow-up with your doctor return if any worsening symptoms.

## 2018-04-21 NOTE — ED Notes (Signed)
ED Provider at bedside. 

## 2018-04-21 NOTE — ED Triage Notes (Signed)
Burning with urination and some blood in it.

## 2018-05-12 ENCOUNTER — Encounter (HOSPITAL_BASED_OUTPATIENT_CLINIC_OR_DEPARTMENT_OTHER): Payer: Self-pay | Admitting: *Deleted

## 2018-05-12 ENCOUNTER — Other Ambulatory Visit: Payer: Self-pay

## 2018-05-12 ENCOUNTER — Emergency Department (HOSPITAL_BASED_OUTPATIENT_CLINIC_OR_DEPARTMENT_OTHER)
Admission: EM | Admit: 2018-05-12 | Discharge: 2018-05-12 | Disposition: A | Discharge status: Home / Self Care | Payer: Self-pay | Attending: Emergency Medicine | Admitting: Emergency Medicine

## 2018-05-12 DIAGNOSIS — F17210 Nicotine dependence, cigarettes, uncomplicated: Secondary | ICD-10-CM | POA: Insufficient documentation

## 2018-05-12 DIAGNOSIS — E162 Hypoglycemia, unspecified: Secondary | ICD-10-CM | POA: Insufficient documentation

## 2018-05-12 DIAGNOSIS — F1210 Cannabis abuse, uncomplicated: Secondary | ICD-10-CM | POA: Insufficient documentation

## 2018-05-12 DIAGNOSIS — R1110 Vomiting, unspecified: Secondary | ICD-10-CM

## 2018-05-12 HISTORY — DX: Cannabis abuse, uncomplicated: F12.10

## 2018-05-12 LAB — CBC WITH DIFFERENTIAL/PLATELET
Abs Immature Granulocytes: 0.11 10*3/uL — ABNORMAL HIGH (ref 0.00–0.07)
Basophils Absolute: 0 10*3/uL (ref 0.0–0.1)
Basophils Relative: 0 %
Eosinophils Absolute: 0 10*3/uL (ref 0.0–0.5)
Eosinophils Relative: 0 %
HEMATOCRIT: 39.6 % (ref 36.0–46.0)
Hemoglobin: 13.3 g/dL (ref 12.0–15.0)
Immature Granulocytes: 1 %
Lymphocytes Relative: 4 %
Lymphs Abs: 0.7 10*3/uL (ref 0.7–4.0)
MCH: 27.5 pg (ref 26.0–34.0)
MCHC: 33.6 g/dL (ref 30.0–36.0)
MCV: 81.8 fL (ref 80.0–100.0)
MONOS PCT: 15 %
Monocytes Absolute: 2.8 10*3/uL — ABNORMAL HIGH (ref 0.1–1.0)
Neutro Abs: 15 10*3/uL — ABNORMAL HIGH (ref 1.7–7.7)
Neutrophils Relative %: 80 %
Platelets: 245 10*3/uL (ref 150–400)
RBC: 4.84 MIL/uL (ref 3.87–5.11)
RDW: 12.6 % (ref 11.5–15.5)
WBC: 18.7 10*3/uL — ABNORMAL HIGH (ref 4.0–10.5)
nRBC: 0 % (ref 0.0–0.2)

## 2018-05-12 LAB — URINALYSIS, ROUTINE W REFLEX MICROSCOPIC
Bilirubin Urine: NEGATIVE
Ketones, ur: NEGATIVE mg/dL
Leukocytes,Ua: NEGATIVE
Nitrite: NEGATIVE
PROTEIN: 100 mg/dL — AB
Specific Gravity, Urine: >1.03 — ABNORMAL HIGH (ref 1.005–1.030)
pH: 6 (ref 5.0–8.0)

## 2018-05-12 LAB — URINALYSIS, MICROSCOPIC (REFLEX)

## 2018-05-12 LAB — COMPREHENSIVE METABOLIC PANEL
ALT: 31 U/L (ref 0–44)
AST: 63 U/L — ABNORMAL HIGH (ref 15–41)
Albumin: 4.7 g/dL (ref 3.5–5.0)
Alkaline Phosphatase: 62 U/L (ref 38–126)
Anion gap: 13 (ref 5–15)
BUN: 16 mg/dL (ref 6–20)
CO2: 20 mmol/L — ABNORMAL LOW (ref 22–32)
Calcium: 9.1 mg/dL (ref 8.9–10.3)
Chloride: 107 mmol/L (ref 98–111)
Creatinine, Ser: 0.98 mg/dL (ref 0.44–1.00)
GFR calc Af Amer: >60 mL/min (ref >60)
GFR calc non Af Amer: >60 mL/min (ref >60)
Glucose, Bld: 70 mg/dL (ref 70–99)
Potassium: 4.2 mmol/L (ref 3.5–5.1)
Sodium: 140 mmol/L (ref 135–145)
Total Bilirubin: 0.6 mg/dL (ref 0.3–1.2)
Total Protein: 7.9 g/dL (ref 6.5–8.1)

## 2018-05-12 LAB — RAPID URINE DRUG SCREEN, HOSP PERFORMED
Amphetamines: NOT DETECTED
BARBITURATES: NOT DETECTED
Benzodiazepines: NOT DETECTED
Cocaine: NOT DETECTED
Opiates: POSITIVE — AB
Tetrahydrocannabinol: POSITIVE — AB

## 2018-05-12 LAB — CBG MONITORING, ED
GLUCOSE-CAPILLARY: 49 mg/dL — AB (ref 70–99)
Glucose-Capillary: 42 mg/dL — CL (ref 70–99)
Glucose-Capillary: 73 mg/dL (ref 70–99)
Glucose-Capillary: 72 mg/dL (ref 70–99)

## 2018-05-12 LAB — LIPASE, BLOOD: Lipase: 26 U/L (ref 11–51)

## 2018-05-12 LAB — PREGNANCY, URINE: PREG TEST UR: NEGATIVE

## 2018-05-12 MED ORDER — NAPROXEN 375 MG PO TABS: 375.0000 mg | ORAL_TABLET | Freq: Two times a day (BID) | ORAL | 0 refills | Status: AC

## 2018-05-12 MED ORDER — SODIUM CHLORIDE 0.9% FLUSH
3.0000 mL | INTRAVENOUS | Status: DC | PRN
  Filled 2018-05-12: qty 3

## 2018-05-12 MED ORDER — LACTATED RINGERS IV BOLUS
1000.0000 mL | Freq: Once | INTRAVENOUS | Status: AC
  Administered 2018-05-12: 1000 mL via INTRAVENOUS

## 2018-05-12 MED ORDER — LACTATED RINGERS IV BOLUS: 1000.0000 mL | Freq: Once | INTRAVENOUS | Status: DC

## 2018-05-12 MED ORDER — ONDANSETRON HCL 4 MG/2ML IJ SOLN
4.0000 mg | Freq: Once | INTRAMUSCULAR | Status: AC
  Administered 2018-05-12: 4 mg via INTRAVENOUS
  Filled 2018-05-12: qty 2

## 2018-05-12 MED ORDER — ACETAMINOPHEN 325 MG PO TABS: 650.0000 mg | ORAL_TABLET | Freq: Once | ORAL | Status: DC

## 2018-05-12 MED ORDER — ONDANSETRON 4 MG PO TBDP: 4.0000 mg | ORAL_TABLET | Freq: Three times a day (TID) | ORAL | 0 refills | Status: DC | PRN

## 2018-05-12 MED ORDER — SODIUM CHLORIDE 0.9 % IV SOLN
250.0000 mL | INTRAVENOUS | Status: DC | PRN
  Administered 2018-05-12: 250 mL via INTRAVENOUS

## 2018-05-12 MED ORDER — SODIUM CHLORIDE 0.9% FLUSH
3.0000 mL | Freq: Two times a day (BID) | INTRAVENOUS | Status: DC
  Filled 2018-05-12: qty 3

## 2018-05-12 NOTE — ED Notes (Signed)
Pt given popcorn-orange juice-and teddy grahams. Pt has no complaints at this time. Currently resting on stretcher in NAD.

## 2018-05-12 NOTE — ED Notes (Signed)
Pt amb to BR w/o difficulty or assist

## 2018-05-12 NOTE — ED Triage Notes (Signed)
Pt was in ED WR leaning over trash bin-mother brought pt in stating that she feels "she took something"-agreed to drug use-she states pt does smoke marijuana-se said she heard pt fall out of bed ~9am-moaning ~10am with vomiting and "like she was out of it"-pt was taken to triage via w/c-admits to taking "perc 30" from "one of my homies"-pt taken to ED tx area-was ambulatory to [facility]

## 2018-05-12 NOTE — ED Provider Notes (Addendum)
[facility] EMERGENCY DEPARTMENT Provider Note   CSN: [id number] Arrival date & time: 05/12/18  1128    History   Chief Complaint Chief Complaint  Patient presents with  . Emesis    HPI Megan George is a 28 y.o. female.     HPI  28 year old female presents for vomiting.  Patient states she took 1 of her friends "perc 30" this morning around 8 or 9 AM.  She thinks he might of fallen asleep while she was trying to clean back in her house.  There was report from her mom that she fell but the patient does not think she fell and instead was on her bed.  She has no headache or head injury.  She is been vomiting since she woke up.  She has not had diarrhea, headache, chest pain, abdominal pain or other new/concerning symptoms.  She smokes marijuana and did so this morning.  She denies other illicit drug use.  No fevers, cough or shortness of breath or exposures to someone known with COVID-19.  Past Medical History:  Diagnosis Date  . Marijuana abuse     Patient Active Problem List   Diagnosis Date Noted  . Sports physical 08/29/2011  . FOOT PAIN, RIGHT 01/30/2010    History reviewed. No pertinent surgical history.   OB History   No obstetric history on file.      Home Medications    Prior to Admission medications   Medication Sig Start Date End Date Taking? Authorizing Provider  cephALEXin (KEFLEX) 500 MG capsule Take 1 capsule (500 mg total) by mouth 3 (three) times daily. 04/21/18   Terrilee Files, MD  naproxen (NAPROSYN) 375 MG tablet Take 1 tablet (375 mg total) by mouth 2 (two) times daily with a meal for 5 days. 05/12/18 05/17/18  Pricilla Loveless, MD  ondansetron (ZOFRAN ODT) 4 MG disintegrating tablet Take 1 tablet (4 mg total) by mouth every 8 (eight) hours as needed. 05/12/18   Pricilla Loveless, MD  phenazopyridine (PYRIDIUM) 200 MG tablet Take 1 tablet (200 mg total) by mouth 3 (three) times daily. 04/21/18   Terrilee Files, MD  selenium sulfide (SELSUN)  2.5 % shampoo Apply 1 application topically daily as needed for irritation. 10/06/17   Kirichenko, Lemont Fillers, PA-C  silver sulfADIAZINE (SILVADENE) 1 % cream Apply 1 application topically daily. 06/21/16   Fawze, Mina A, PA-C  terbinafine (LAMISIL AT) 1 % cream Apply 1 application topically 2 (two) times daily. 10/06/17   Jaynie Crumble, PA-C    Family History Family History  Problem Relation Age of Onset  . Sudden death Neg Hx   . Heart attack Neg Hx     Social History Social History   Tobacco Use  . Smoking status: Current Some Day Smoker    Packs/day: 0.50  . Smokeless tobacco: Never Used  Substance Use Topics  . Alcohol use: No  . Drug use: Yes    Types: Marijuana    Comment: last marijuana last night     Allergies   Patient has no known allergies.   Review of Systems Review of Systems  Constitutional: Negative for fever.  Respiratory: Negative for cough and shortness of breath.   Cardiovascular: Negative for chest pain.  Gastrointestinal: Positive for nausea and vomiting. Negative for abdominal pain and diarrhea.  Genitourinary: Negative for dysuria.  Neurological: Negative for headaches.  All other systems reviewed and are negative.    Physical Exam Updated Vital Signs BP 101/79 (BP Location: Left  Arm)   Pulse 90   Temp 97.6 F (36.4 C) (Oral)   Resp 15   Wt 56.7 kg   LMP 05/12/2018   SpO2 100%   BMI 20.18 kg/m   Physical Exam Vitals signs and nursing note reviewed.  Constitutional:      Appearance: She is well-developed.  HENT:     Head: Normocephalic and atraumatic.     Right Ear: External ear normal.     Left Ear: External ear normal.     Nose: Nose normal.  Eyes:     General:        Right eye: No discharge.        Left eye: No discharge.     Extraocular Movements: Extraocular movements intact.     Pupils: Pupils are equal, round, and reactive to light.  Cardiovascular:     Rate and Rhythm: Regular rhythm. Tachycardia present.      Heart sounds: Normal heart sounds.  Pulmonary:     Effort: Pulmonary effort is normal.     Breath sounds: Normal breath sounds.  Abdominal:     Palpations: Abdomen is soft.     Tenderness: There is no abdominal tenderness.  Skin:    General: Skin is warm and dry.  Neurological:     Mental Status: She is alert and oriented to person, place, and time.     Comments: CN 3-12 grossly intact. 5/5 strength in all 4 extremities. Grossly normal sensation. Normal finger to nose.   Psychiatric:        Mood and Affect: Mood is not anxious.      ED Treatments / Results  Labs (all labs ordered are listed, but only abnormal results are displayed) Labs Reviewed  URINALYSIS, ROUTINE W REFLEX MICROSCOPIC - Abnormal; Notable for the following components:      Result Value   Color, Urine AMBER (*)    Specific Gravity, Urine >1.030 (*)    Glucose, UA >=500 (*)    Hgb urine dipstick LARGE (*)    Protein, ur 100 (*)    All other components within normal limits  RAPID URINE DRUG SCREEN, HOSP PERFORMED - Abnormal; Notable for the following components:   Opiates POSITIVE (*)    Tetrahydrocannabinol POSITIVE (*)    All other components within normal limits  COMPREHENSIVE METABOLIC PANEL - Abnormal; Notable for the following components:   CO2 20 (*)    AST 63 (*)    All other components within normal limits  CBC WITH DIFFERENTIAL/PLATELET - Abnormal; Notable for the following components:   WBC 18.7 (*)    Neutro Abs 15.0 (*)    Monocytes Absolute 2.8 (*)    Abs Immature Granulocytes 0.11 (*)    All other components within normal limits  URINALYSIS, MICROSCOPIC (REFLEX) - Abnormal; Notable for the following components:   Bacteria, UA MANY (*)    All other components within normal limits  CBG MONITORING, ED - Abnormal; Notable for the following components:   Glucose-Capillary 49 (*)    All other components within normal limits  CBG MONITORING, ED - Abnormal; Notable for the following components:    Glucose-Capillary 42 (*)    All other components within normal limits  PREGNANCY, URINE  LIPASE, BLOOD  CBG MONITORING, ED  CBG MONITORING, ED    EKG EKG Interpretation  Date/Time:  Tuesday May 12 2018 12:12:27 EDT Ventricular Rate:  99 PR Interval:    QRS Duration: 100 QT Interval:  339 QTC Calculation: 435 R  Axis:   90 Text Interpretation:  Sinus tachycardia Borderline right axis deviation Nonspecific T abnormalities, inferior leads Baseline wander in lead(s) V1 No old tracing to compare Confirmed by Pricilla LovelessGoldston, Jowanna Loeffler [phone]([id number]) on 05/12/2018 12:15:20 PM Also confirmed by Pricilla LovelessGoldston, Ruble Buttler [phone]([id number]), editor Barbette Hairassel, Kerry [phone]([id number])  on 05/12/2018 1:34:18 PM   Radiology No results found.  Procedures Procedures (including critical care time)  Medications Ordered in ED Medications  sodium chloride flush (NS) 0.9 % injection 3 mL (has no administration in time range)  sodium chloride flush (NS) 0.9 % injection 3 mL (has no administration in time range)  0.9 %  sodium chloride infusion (0 mLs Intravenous Stopped 05/12/18 1244)  ondansetron (ZOFRAN) injection 4 mg (4 mg Intravenous Given 05/12/18 1215)  lactated ringers bolus 1,000 mL ( Intravenous Stopped 05/12/18 1454)     Initial Impression / Assessment and Plan / ED Course  I have reviewed the triage vital signs and the nursing notes.  Pertinent labs & imaging results that were available during my care of the patient were reviewed by me and considered in my medical decision making (see chart for details).        Patient feels fine except she is vomiting.  Given the tachycardia, she was given fluids and labs were obtained.  She is noted to be hypoglycemic though not symptomatic.  Initially this did not come up with some orange juice and then she was given more food with peanut butter and more carbohydrates.  Now her glucose has come up and is steady.  She feels fine and is no longer vomiting and her vital signs have normalized.  She  did not eat anything this morning, which combined with vomiting likely caused the hypoglycemia. There is no abdominal pain.  The vomiting is likely related to the oxycodone she took as well as marijuana.  When mom brought her in there is a question of a possible head injury but she has no headache, is alert and oriented and has a benign neuro exam with no head trauma.  I do not think CT imaging is needed.  She has asymptomatic bacteriuria and just started her cycle.  She does tell me that 1 of the reason she took this medicine this morning was because of the pain of her cycle.  I discussed NSAIDs are a better course and have prescribed her naproxen.  Otherwise she appears stable for discharge home with return precautions.  Final Clinical Impressions(s) / ED Diagnoses   Final diagnoses:  Hypoglycemia  Vomiting in adult    ED Discharge Orders         Ordered    naproxen (NAPROSYN) 375 MG tablet  2 times daily with meals     05/12/18 1447    ondansetron (ZOFRAN ODT) 4 MG disintegrating tablet  Every 8 hours PRN     05/12/18 1447           Pricilla LovelessGoldston, Kleber Crean, MD 05/12/18 [id number]    Pricilla LovelessGoldston, Alvan Culpepper, MD 05/12/18 1510

## 2018-05-12 NOTE — ED Notes (Signed)
Pt amb to br w/o assist or difficulty

## 2018-05-12 NOTE — ED Notes (Signed)
CBg 42 , MD aware , ane PB and crackers given with coke

## 2019-08-11 ENCOUNTER — Emergency Department (HOSPITAL_BASED_OUTPATIENT_CLINIC_OR_DEPARTMENT_OTHER)
Admission: EM | Admit: 2019-08-11 | Discharge: 2019-08-11 | Disposition: A | Discharge status: Home / Self Care | Payer: Self-pay | Attending: Emergency Medicine | Admitting: Emergency Medicine

## 2019-08-11 ENCOUNTER — Other Ambulatory Visit: Payer: Self-pay

## 2019-08-11 ENCOUNTER — Encounter (HOSPITAL_BASED_OUTPATIENT_CLINIC_OR_DEPARTMENT_OTHER): Payer: Self-pay

## 2019-08-11 DIAGNOSIS — Z20822 Contact with and (suspected) exposure to covid-19: Secondary | ICD-10-CM | POA: Insufficient documentation

## 2019-08-11 DIAGNOSIS — J029 Acute pharyngitis, unspecified: Secondary | ICD-10-CM | POA: Insufficient documentation

## 2019-08-11 DIAGNOSIS — F17200 Nicotine dependence, unspecified, uncomplicated: Secondary | ICD-10-CM | POA: Insufficient documentation

## 2019-08-11 LAB — SARS CORONAVIRUS 2 BY RT PCR (HOSPITAL ORDER, PERFORMED IN ~~LOC~~ HOSPITAL LAB): SARS Coronavirus 2: NEGATIVE

## 2019-08-11 NOTE — ED Provider Notes (Signed)
[facility] EMERGENCY DEPARTMENT Provider Note   CSN: [id number] Arrival date & time: 08/11/19  1114     History Chief Complaint  Patient presents with  . Sore Throat    Megan George is a 29 y.o. female presents to ED for evaluation of sore throat. Onset this morning when she woke up.  States it is mild, "not bad enough for antibiotics". States she wants to make sure she doesn't get anybody else sick at work.  Pain is worse with swallowing. No other associated symptoms like fever, congestion, cough, CP, vomiting, diarrhea, loss of taste or smell, myalgias.  No sick contacts. No travel. Not vaccinated for COVID.  No sick contacts or exposure to covid or strep.   HPI     Past Medical History:  Diagnosis Date  . Marijuana abuse     Patient Active Problem List   Diagnosis Date Noted  . Sports physical 08/29/2011  . FOOT PAIN, RIGHT 01/30/2010    History reviewed. No pertinent surgical history.   OB History   No obstetric history on file.     Family History  Problem Relation Age of Onset  . Sudden death Neg Hx   . Heart attack Neg Hx     Social History   Tobacco Use  . Smoking status: Current Some Day Smoker    Packs/day: 0.50  . Smokeless tobacco: Never Used  Vaping Use  . Vaping Use: Every day  Substance Use Topics  . Alcohol use: No  . Drug use: Yes    Types: Marijuana    Home Medications Prior to Admission medications   Medication Sig Start Date End Date Taking? Authorizing Provider  cephALEXin (KEFLEX) 500 MG capsule Take 1 capsule (500 mg total) by mouth 3 (three) times daily. 04/21/18   Terrilee Files, MD  ondansetron (ZOFRAN ODT) 4 MG disintegrating tablet Take 1 tablet (4 mg total) by mouth every 8 (eight) hours as needed. 05/12/18   Pricilla Loveless, MD  phenazopyridine (PYRIDIUM) 200 MG tablet Take 1 tablet (200 mg total) by mouth 3 (three) times daily. 04/21/18   Terrilee Files, MD  selenium sulfide (SELSUN) 2.5 % shampoo Apply 1  application topically daily as needed for irritation. 10/06/17   Kirichenko, Lemont Fillers, PA-C  silver sulfADIAZINE (SILVADENE) 1 % cream Apply 1 application topically daily. 06/21/16   Fawze, Mina A, PA-C  terbinafine (LAMISIL AT) 1 % cream Apply 1 application topically 2 (two) times daily. 10/06/17   Jaynie Crumble, PA-C    Allergies    Patient has no known allergies.  Review of Systems   Review of Systems  HENT: Positive for sore throat.   All other systems reviewed and are negative.   Physical Exam Updated Vital Signs BP 119/84 (BP Location: Right Arm)   Pulse 86   Temp 99 F (37.2 C) (Oral)   Resp 18   Ht 5\' 6"  (1.676 m)   Wt 59 kg   LMP 07/11/2019   SpO2 97%   BMI 20.98 kg/m   Physical Exam Constitutional:      Appearance: She is well-developed.  HENT:     Head: Normocephalic.     Nose: Nose normal.     Mouth/Throat:     Pharynx: Posterior oropharyngeal erythema present.     Tonsils: 1+ on the right. 1+ on the left.     Comments: Mild bilateral tonsillary hypertrophy. No exudates. Uvula midline.  Normal phonation. No trismus. Tolerating secretions. Normal sublingual space, tongue protrusion.  No stridor.  Eyes:     General: Lids are normal.  Neck:     Comments: No cervical lymphadenopathy. Trachea midline. No asymmetric edema of the neck Cardiovascular:     Rate and Rhythm: Normal rate.  Pulmonary:     Effort: Pulmonary effort is normal. No respiratory distress.     Breath sounds: Normal breath sounds.  Musculoskeletal:        General: Normal range of motion.     Cervical back: Normal range of motion.  Neurological:     Mental Status: She is alert.  Psychiatric:        Behavior: Behavior normal.     ED Results / Procedures / Treatments   Labs (all labs ordered are listed, but only abnormal results are displayed) Labs Reviewed  SARS CORONAVIRUS 2 BY RT PCR (HOSPITAL ORDER, PERFORMED IN [facility])    EKG None  Radiology No results  found.  Procedures Procedures (including critical care time)  Medications Ordered in ED Medications - No data to display  ED Course  I have reviewed the triage vital signs and the nursing notes.  Pertinent labs & imaging results that were available during my care of the patient were reviewed by me and considered in my medical decision making (see chart for details).    MDM Rules/Calculators/A&P                          29 year old otherwise healthy female presents for sore throat for less than 24 hours.  Exam is benign, as she has symmetric tonsillar hypertrophy with erythema. No exudates. No red flags like asymmetric tonsillar hypertrophy, uvular deviation, muffled voice, trismus, drooling. Doubt significant or serious deep neck or throat infection. Doubt tonsillar abscess, RPA, Ludwig's, PTA, epiglottitis. No exposure to sick contacts, strep. Centor score is low.  No need for antibiotics. Highest on differential diagnosis is viral pharyngitis.   Patient tested for COVID-19 which is pending at discharge. Recommended symptomatic management. Recommended isolation until results return. Return precautions discussed. She is comfortable with the plan.  Final Clinical Impression(s) / ED Diagnoses Final diagnoses:  Viral pharyngitis    Rx / DC Orders ED Discharge Orders    None       Liberty Handy, PA-C 08/11/19 1328    Terrilee Files, MD 08/11/19 2039

## 2019-08-11 NOTE — Discharge Instructions (Signed)
You were seen in the ED for sore throat  Covid test is pending, follow-up with the results on your MyChart account. Someone should call you in the next 24 hours if your test is positive.  Until you have the results I recommend isolation and strict precautions to prevent spread  Alternate ibuprofen and acetaminophen every 6-8 hours for sore throat and inflammation. Warm/hot liquids.  Return to the ED for worsening or severe throat pain, fevers, difficulty swallowing, drooling, changes to her voice, neck pain or stiffness or swelling

## 2019-08-11 NOTE — ED Triage Notes (Signed)
Pt c/o sore throat x today-denies cough/fever other URI sx-NAD-steady gait

## 2019-10-29 ENCOUNTER — Other Ambulatory Visit: Payer: Self-pay

## 2019-10-29 ENCOUNTER — Emergency Department (HOSPITAL_BASED_OUTPATIENT_CLINIC_OR_DEPARTMENT_OTHER)
Admission: EM | Admit: 2019-10-29 | Discharge: 2019-10-29 | Disposition: A | Discharge status: Home / Self Care | Payer: Self-pay | Attending: Emergency Medicine | Admitting: Emergency Medicine

## 2019-10-29 ENCOUNTER — Encounter (HOSPITAL_BASED_OUTPATIENT_CLINIC_OR_DEPARTMENT_OTHER): Payer: Self-pay

## 2019-10-29 DIAGNOSIS — H02841 Edema of right upper eyelid: Secondary | ICD-10-CM | POA: Insufficient documentation

## 2019-10-29 DIAGNOSIS — F17200 Nicotine dependence, unspecified, uncomplicated: Secondary | ICD-10-CM | POA: Insufficient documentation

## 2019-10-29 DIAGNOSIS — H02843 Edema of right eye, unspecified eyelid: Secondary | ICD-10-CM

## 2019-10-29 MED ORDER — FLUORESCEIN SODIUM 1 MG OP STRP
1.0000 | ORAL_STRIP | Freq: Once | OPHTHALMIC | Status: AC
  Administered 2019-10-29: 1 via OPHTHALMIC
  Filled 2019-10-29: qty 1

## 2019-10-29 MED ORDER — TETRACAINE HCL 0.5 % OP SOLN
2.0000 [drp] | Freq: Once | OPHTHALMIC | Status: AC
  Administered 2019-10-29: 2 [drp] via OPHTHALMIC
  Filled 2019-10-29: qty 4

## 2019-10-29 NOTE — ED Provider Notes (Signed)
[facility] EMERGENCY DEPARTMENT Provider Note   CSN: [id number] Arrival date & time: 10/29/19  4098     History Chief Complaint  Patient presents with  . Eye Pain    Megan George is a 29 y.o. female.  29 year old female presents with complaint of intermittent right upper eye swelling.  Patient states that this has been going on for the past several months, states at times her eye feels irritated like there is an eyelash or something in her eye.  Symptoms resolved spontaneously.  States that time she has stringy eye discharge however does not have this currently.  Denies eye redness, visual disturbance, eye injury or known foreign body.  Patient has not done anything to treat her symptoms at home.  Reports history of allergies to cats, not taking any allergy medications currently.  Patient does not wear glasses or contacts.  No other complaints or concerns.        Past Medical History:  Diagnosis Date  . Marijuana abuse     Patient Active Problem List   Diagnosis Date Noted  . Sports physical 08/29/2011  . FOOT PAIN, RIGHT 01/30/2010    History reviewed. No pertinent surgical history.   OB History   No obstetric history on file.     Family History  Problem Relation Age of Onset  . Sudden death Neg Hx   . Heart attack Neg Hx     Social History   Tobacco Use  . Smoking status: Current Some Day Smoker    Packs/day: 0.50  . Smokeless tobacco: Never Used  Vaping Use  . Vaping Use: Every day  Substance Use Topics  . Alcohol use: No  . Drug use: Yes    Types: Marijuana    Home Medications Prior to Admission medications   Not on File    Allergies    Patient has no known allergies.  Review of Systems   Review of Systems  Constitutional: Negative for fever.  Eyes: Positive for discharge and itching. Negative for photophobia, pain, redness and visual disturbance.  Skin: Negative for rash and wound.  Allergic/Immunologic: Negative for  immunocompromised state.  Neurological: Negative for headaches.  Hematological: Negative for adenopathy.    Physical Exam Updated Vital Signs BP 129/85 (BP Location: Left Arm)   Pulse 89   Temp 98.5 F (36.9 C) (Oral)   Resp 18   Ht 5\' 6"  (1.676 m)   Wt 59 kg   LMP 10/22/2019   SpO2 98%   BMI 20.98 kg/m   Physical Exam Vitals and nursing note reviewed.  Constitutional:      General: She is not in acute distress.    Appearance: She is well-developed. She is not diaphoretic.  HENT:     Head: Normocephalic and atraumatic.  Eyes:     General:        Right eye: No discharge.        Left eye: No discharge.     Extraocular Movements: Extraocular movements intact.     Conjunctiva/sclera: Conjunctivae normal.     Right eye: Right conjunctiva is not injected. No chemosis, exudate or hemorrhage.    Pupils: Pupils are equal, round, and reactive to light.     Right eye: No corneal abrasion or fluorescein uptake. Seidel exam negative.     Slit lamp exam:    Right eye: Anterior chamber quiet.  Pulmonary:     Effort: Pulmonary effort is normal.  Skin:    General: Skin is warm  and dry.     Findings: No erythema or rash.  Neurological:     Mental Status: She is alert and oriented to person, place, and time.  Psychiatric:        Behavior: Behavior normal.     ED Results / Procedures / Treatments   Labs (all labs ordered are listed, but only abnormal results are displayed) Labs Reviewed - No data to display  EKG None  Radiology No results found.  Procedures Procedures (including critical care time)  Medications Ordered in ED Medications  fluorescein ophthalmic strip 1 strip (1 strip Right Eye Given 10/29/19 1101)  tetracaine (PONTOCAINE) 0.5 % ophthalmic solution 2 drop (2 drops Right Eye Given 10/29/19 1100)    ED Course  I have reviewed the triage vital signs and the nursing notes.  Pertinent labs & imaging results that were available during my care of the patient  were reviewed by me and considered in my medical decision making (see chart for details).  Clinical Course as of Oct 29 1111  Fri Oct 29, 2019  [id number] 29 year old female with right upper eye lid swelling, intermittent. On exam, very slight swelling noted, no obvious stye, normal fluorescein exam. Recommend cool compresses as needed, zyrtec daily, follow up with PCP if symptoms continue.     [LM]    Clinical Course User Index [LM] Alden Hipp   MDM Rules/Calculators/A&P                          Final Clinical Impression(s) / ED Diagnoses Final diagnoses:  Swelling of right eyelid    Rx / DC Orders ED Discharge Orders    None       Jeannie Fend, PA-C 10/29/19 1113    Virgina Norfolk, DO 10/29/19 1114

## 2019-10-29 NOTE — Discharge Instructions (Addendum)
Take Zyrtec daily as directed. Cool compress to eyes as needed for swelling. Follow up with primary care if symptoms continue.

## 2019-10-29 NOTE — ED Triage Notes (Signed)
Pt arrives ambulatory to ED with c/o swelling, pain, and itching to both eyes (right worse than left) X3 days.

## 2020-10-08 ENCOUNTER — Other Ambulatory Visit: Payer: Self-pay

## 2020-10-08 ENCOUNTER — Emergency Department (HOSPITAL_BASED_OUTPATIENT_CLINIC_OR_DEPARTMENT_OTHER)
Admission: EM | Admit: 2020-10-08 | Discharge: 2020-10-08 | Disposition: A | Discharge status: Home / Self Care | Payer: Self-pay | Attending: Emergency Medicine | Admitting: Emergency Medicine

## 2020-10-08 ENCOUNTER — Encounter (HOSPITAL_BASED_OUTPATIENT_CLINIC_OR_DEPARTMENT_OTHER): Payer: Self-pay | Admitting: Emergency Medicine

## 2020-10-08 DIAGNOSIS — F17210 Nicotine dependence, cigarettes, uncomplicated: Secondary | ICD-10-CM | POA: Insufficient documentation

## 2020-10-08 DIAGNOSIS — Z5321 Procedure and treatment not carried out due to patient leaving prior to being seen by health care provider: Secondary | ICD-10-CM | POA: Insufficient documentation

## 2020-10-08 DIAGNOSIS — R112 Nausea with vomiting, unspecified: Secondary | ICD-10-CM

## 2020-10-08 DIAGNOSIS — R197 Diarrhea, unspecified: Secondary | ICD-10-CM | POA: Insufficient documentation

## 2020-10-08 LAB — CBC WITH DIFFERENTIAL/PLATELET
Abs Immature Granulocytes: 0.01 10*3/uL (ref 0.00–0.07)
Basophils Absolute: 0 10*3/uL (ref 0.0–0.1)
Basophils Relative: 1 %
Eosinophils Absolute: 0.2 10*3/uL (ref 0.0–0.5)
Eosinophils Relative: 3 %
HCT: 32.2 % — ABNORMAL LOW (ref 36.0–46.0)
Hemoglobin: 11.5 g/dL — ABNORMAL LOW (ref 12.0–15.0)
Immature Granulocytes: 0 %
Lymphocytes Relative: 37 %
Lymphs Abs: 1.7 10*3/uL (ref 0.7–4.0)
MCH: 27.8 pg (ref 26.0–34.0)
MCHC: 35.7 g/dL (ref 30.0–36.0)
MCV: 77.8 fL — ABNORMAL LOW (ref 80.0–100.0)
Monocytes Absolute: 0.6 10*3/uL (ref 0.1–1.0)
Monocytes Relative: 12 %
Neutro Abs: 2.2 10*3/uL (ref 1.7–7.7)
Neutrophils Relative %: 47 %
Platelets: 279 10*3/uL (ref 150–400)
RBC: 4.14 MIL/uL (ref 3.87–5.11)
RDW: 13.2 % (ref 11.5–15.5)
WBC: 4.7 10*3/uL (ref 4.0–10.5)
nRBC: 0 % (ref 0.0–0.2)

## 2020-10-08 LAB — BASIC METABOLIC PANEL
Anion gap: 9 (ref 5–15)
BUN: 7 mg/dL (ref 6–20)
CO2: 27 mmol/L (ref 22–32)
Calcium: 8.7 mg/dL — ABNORMAL LOW (ref 8.9–10.3)
Chloride: 99 mmol/L (ref 98–111)
Creatinine, Ser: 0.63 mg/dL (ref 0.44–1.00)
GFR, Estimated: >60 mL/min (ref >60)
Glucose, Bld: 92 mg/dL (ref 70–99)
Potassium: 3.5 mmol/L (ref 3.5–5.1)
Sodium: 135 mmol/L (ref 135–145)

## 2020-10-08 LAB — HEPATIC FUNCTION PANEL
ALT: 8 U/L (ref 0–44)
AST: 14 U/L — ABNORMAL LOW (ref 15–41)
Albumin: 3.9 g/dL (ref 3.5–5.0)
Alkaline Phosphatase: 49 U/L (ref 38–126)
Bilirubin, Direct: <0.1 mg/dL (ref 0.0–0.2)
Total Bilirubin: 0.4 mg/dL (ref 0.3–1.2)
Total Protein: 6.7 g/dL (ref 6.5–8.1)

## 2020-10-08 LAB — HCG, SERUM, QUALITATIVE: Preg, Serum: NEGATIVE

## 2020-10-08 LAB — LIPASE, BLOOD: Lipase: 21 U/L (ref 11–51)

## 2020-10-08 MED ORDER — SODIUM CHLORIDE 0.9 % IV BOLUS
500.0000 mL | Freq: Once | INTRAVENOUS | Status: AC
  Administered 2020-10-08: 500 mL via INTRAVENOUS

## 2020-10-08 MED ORDER — ONDANSETRON 4 MG PO TBDP: 4.0000 mg | ORAL_TABLET | Freq: Three times a day (TID) | ORAL | 0 refills | Status: AC | PRN

## 2020-10-08 NOTE — ED Provider Notes (Signed)
[facility] EMERGENCY DEPARTMENT Provider Note   CSN: [id number] Arrival date & time: 10/08/20  1135     History Chief Complaint  Patient presents with   Emesis    Megan George is a 30 y.o. female who presents with intermittent nausea, vomiting, and diarrhea for several months. Patient reports approximately 9 episodes of emesis in the past 24 hours. Symptoms do not seem to be associated with any particular foods. Episodes of diarrhea associated with foul smell, no blood. No changes in diet, no exposure to others with similar symptoms. Denies fever, chills, abdominal pain, dysuria, hematuria, bloody emesis or stool. Takes no medications. Patient smokes marijuana. Does not drink alcohol.   The history is provided by the patient.  Emesis Associated symptoms: diarrhea   Associated symptoms: no abdominal pain, no chills and no fever       Past Medical History:  Diagnosis Date   Marijuana abuse     Patient Active Problem List   Diagnosis Date Noted   Sports physical 08/29/2011   FOOT PAIN, RIGHT 01/30/2010    History reviewed. No pertinent surgical history.   OB History   No obstetric history on file.     Family History  Problem Relation Age of Onset   Sudden death Neg Hx    Heart attack Neg Hx     Social History   Tobacco Use   Smoking status: Some Days    Packs/day: 0.50    Types: Cigarettes   Smokeless tobacco: Never  Vaping Use   Vaping Use: Every day   Substances: Flavoring  Substance Use Topics   Alcohol use: No   Drug use: Yes    Types: Marijuana    Home Medications Prior to Admission medications   Medication Sig Start Date End Date Taking? Authorizing Provider  ondansetron (ZOFRAN ODT) 4 MG disintegrating tablet Take 1 tablet (4 mg total) by mouth every 8 (eight) hours as needed for nausea or vomiting. 10/08/20  Yes Airyanna Dipalma T, PA-C    Allergies    Patient has no known allergies.  Review of Systems   Review of Systems   Constitutional:  Negative for chills and fever.  Respiratory:  Negative for shortness of breath.   Cardiovascular:  Negative for chest pain.  Gastrointestinal:  Positive for diarrhea, nausea and vomiting. Negative for abdominal pain, blood in stool and rectal pain.  Genitourinary:  Negative for dysuria and hematuria.  All other systems reviewed and are negative.  Physical Exam Updated Vital Signs BP 131/82 (BP Location: Right Arm)   Pulse 65   Temp 98.7 F (37.1 C) (Oral)   Resp 18   Ht 5\' 6"  (1.676 m)   Wt 56.7 kg   LMP 10/04/2020 (Exact Date)   SpO2 100%   BMI 20.18 kg/m   Physical Exam Vitals and nursing note reviewed.  Constitutional:      Appearance: Normal appearance.  HENT:     Head: Normocephalic and atraumatic.  Eyes:     Conjunctiva/sclera: Conjunctivae normal.  Cardiovascular:     Rate and Rhythm: Normal rate and regular rhythm.  Pulmonary:     Effort: Pulmonary effort is normal. No respiratory distress.     Breath sounds: Normal breath sounds.  Abdominal:     General: There is no distension.     Palpations: Abdomen is soft.     Tenderness: There is no abdominal tenderness.  Skin:    General: Skin is warm and dry.  Neurological:  General: No focal deficit present.     Mental Status: She is alert and oriented to person, place, and time.    ED Results / Procedures / Treatments   Labs (all labs ordered are listed, but only abnormal results are displayed) Labs Reviewed  CBC WITH DIFFERENTIAL/PLATELET - Abnormal; Notable for the following components:      Result Value   Hemoglobin 11.5 (*)    HCT 32.2 (*)    MCV 77.8 (*)    All other components within normal limits  BASIC METABOLIC PANEL - Abnormal; Notable for the following components:   Calcium 8.7 (*)    All other components within normal limits  HCG, SERUM, QUALITATIVE  HEPATIC FUNCTION PANEL  LIPASE, BLOOD    EKG None  Radiology No results found.  Procedures Procedures    Medications Ordered in ED Medications  sodium chloride 0.9 % bolus 500 mL ( Intravenous Stopped 10/08/20 1319)    ED Course  I have reviewed the triage vital signs and the nursing notes.  Pertinent labs & imaging results that were available during my care of the patient were reviewed by me and considered in my medical decision making (see chart for details).    MDM Rules/Calculators/A&P                           Patient is 30 y/o female who presents with intermittent nausea, vomiting, and diarrhea for several months. Patient reports approximately 9 episodes of emesis in the past 24 hours. She is not nauseous at this time. Denies fever, chills, abdominal pain, dysuria, hematuria, bloody emesis or stool. Patient smokes marijuana, does not drink alcohol.   Concern for hyperemesis related to marijuana use, IBS, gastroenteritis. Gave IVF. CBC shows no leukocytosis. BMP unremarkable. Urine pregnancy negative. Lipase and hepatic function panel pending. If results are normal, plan to discharge to home with zofran and GI follow up. Patient agreeable to plan.  Patient discussed and care transferred to [facility] PA-C at shift change. See her note for disposition.   Final Clinical Impression(s) / ED Diagnoses Final diagnoses:  Nausea vomiting and diarrhea    Rx / DC Orders ED Discharge Orders          Ordered    ondansetron (ZOFRAN ODT) 4 MG disintegrating tablet  Every 8 hours PRN        10/08/20 1459             Zarianna Dicarlo T, PA-C 10/08/20 1509    Maia Plan, MD 10/12/20 [phone]

## 2020-10-08 NOTE — Discharge Instructions (Addendum)
Your lab work today was reassuring. I am writing you a prescription for zofran, which is an anti-nausea medication that you can take as needed. Continue to drink plenty of fluids, and try for more water instead of soft drinks.   I am giving you the contact information for a GI doctor to follow up and discuss long term management of your symptoms.   Monitor your symptoms and return to the ED for new or worsening abdominal pain or persistent vomiting despite taking zofran.

## 2020-10-08 NOTE — ED Triage Notes (Signed)
Pt reports intermittent N/V/D x 2-3 months. Currently patient reports having just nausea and vomiting. Pt denies change in diet or exposure to others with illness. Pt ate couple of apple slices today, has not had any fluids.

## 2020-10-08 NOTE — ED Provider Notes (Signed)
  Physical Exam  BP 131/82 (BP Location: Right Arm)   Pulse 65   Temp 98.7 F (37.1 C) (Oral)   Resp 18   Ht 5\' 6"  (1.676 m)   Wt 56.7 kg   LMP 10/04/2020 (Exact Date)   SpO2 100%   BMI 20.18 kg/m   Physical Exam  ED Course/Procedures     Procedures  MDM    Care of this patient is presenting to provider Lorin Roemhildt, PA-C at time of shift change.  Please see her associated note for further insight into the patient's ED course.  In brief patient is a 30 year old female with several months nausea, vomiting , And diarrhea with 9 episodes of NBNB emesis in the last 24 hours.  Presented for evaluation.  Does not have abdominal pain.  At time of shift change we will completion of laboratory results.  So far CBC with mild anemia with hemoglobin 11.5, mildly decreased from her baseline of 13.  BMP unremarkable.  Patient is not pregnant.  Hepatic function panel and lipase are pending at this time.  Disposition plan remains for discharge home with outpatient GI follow-up.  Zofran showed her laboratory studies remain normal.  I was informed by RN that patient has eloped from the emergency department.  Hepatic function panel and lipase remain on resulted at this time.  Patient return to the ER at any time.  This chart was dictated using voice recognition software, Dragon. Despite the best efforts of this provider to proofread and correct errors, errors may still occur which can change documentation meaning.    26, PA-C 10/08/20 1547    10/10/20, MD 10/12/20 [phone]

## 2020-10-08 NOTE — ED Notes (Signed)
Not in room, IV laying on stretcher. ED PA informed

## 2020-12-05 ENCOUNTER — Other Ambulatory Visit: Payer: Self-pay

## 2020-12-05 ENCOUNTER — Emergency Department (HOSPITAL_BASED_OUTPATIENT_CLINIC_OR_DEPARTMENT_OTHER)
Admission: EM | Admit: 2020-12-05 | Discharge: 2020-12-05 | Disposition: A | Discharge status: Home / Self Care | Payer: Self-pay | Attending: Emergency Medicine | Admitting: Emergency Medicine

## 2020-12-05 ENCOUNTER — Encounter (HOSPITAL_BASED_OUTPATIENT_CLINIC_OR_DEPARTMENT_OTHER): Payer: Self-pay

## 2020-12-05 DIAGNOSIS — R531 Weakness: Secondary | ICD-10-CM | POA: Insufficient documentation

## 2020-12-05 DIAGNOSIS — R5383 Other fatigue: Secondary | ICD-10-CM | POA: Insufficient documentation

## 2020-12-05 DIAGNOSIS — F17210 Nicotine dependence, cigarettes, uncomplicated: Secondary | ICD-10-CM | POA: Insufficient documentation

## 2020-12-05 DIAGNOSIS — E876 Hypokalemia: Secondary | ICD-10-CM | POA: Insufficient documentation

## 2020-12-05 LAB — CBC WITH DIFFERENTIAL/PLATELET
Abs Immature Granulocytes: 0.02 10*3/uL (ref 0.00–0.07)
Basophils Absolute: 0 10*3/uL (ref 0.0–0.1)
Basophils Relative: 0 %
Eosinophils Absolute: 0 10*3/uL (ref 0.0–0.5)
Eosinophils Relative: 0 %
HCT: 38.5 % (ref 36.0–46.0)
Hemoglobin: 13.8 g/dL (ref 12.0–15.0)
Immature Granulocytes: 0 %
Lymphocytes Relative: 13 %
Lymphs Abs: 1.1 10*3/uL (ref 0.7–4.0)
MCH: 27.7 pg (ref 26.0–34.0)
MCHC: 35.8 g/dL (ref 30.0–36.0)
MCV: 77.2 fL — ABNORMAL LOW (ref 80.0–100.0)
Monocytes Absolute: 0.6 10*3/uL (ref 0.1–1.0)
Monocytes Relative: 6 %
Neutro Abs: 7.3 10*3/uL (ref 1.7–7.7)
Neutrophils Relative %: 81 %
Platelets: 354 10*3/uL (ref 150–400)
RBC: 4.99 MIL/uL (ref 3.87–5.11)
RDW: 13.2 % (ref 11.5–15.5)
WBC: 9.1 10*3/uL (ref 4.0–10.5)
nRBC: 0 % (ref 0.0–0.2)

## 2020-12-05 LAB — COMPREHENSIVE METABOLIC PANEL
ALT: 9 U/L (ref 0–44)
AST: 16 U/L (ref 15–41)
Albumin: 4.3 g/dL (ref 3.5–5.0)
Alkaline Phosphatase: 60 U/L (ref 38–126)
Anion gap: 8 (ref 5–15)
BUN: 7 mg/dL (ref 6–20)
CO2: 24 mmol/L (ref 22–32)
Calcium: 9.3 mg/dL (ref 8.9–10.3)
Chloride: 105 mmol/L (ref 98–111)
Creatinine, Ser: 0.73 mg/dL (ref 0.44–1.00)
GFR, Estimated: >60 mL/min (ref >60)
Glucose, Bld: 142 mg/dL — ABNORMAL HIGH (ref 70–99)
Potassium: 3.4 mmol/L — ABNORMAL LOW (ref 3.5–5.1)
Sodium: 137 mmol/L (ref 135–145)
Total Bilirubin: 0.6 mg/dL (ref 0.3–1.2)
Total Protein: 7.7 g/dL (ref 6.5–8.1)

## 2020-12-05 NOTE — ED Notes (Addendum)
Pt seen ambulating out of ED prior to dischare dispo.  When asked if she was leaving, pt reports "well I was told that yall cant do nothing for me..."  RN asked pt if she would like to wait for discharge paperwork, to which she declined.  Pt ambulatory out of ED, NAD. EDP Stevphen Meuse, PA made aware.

## 2020-12-05 NOTE — ED Triage Notes (Signed)
Pt arrives ambulatory to ED with c/o feeling weak states she does not eat and drink like she is supposed to and thinks she may be dehydrated. Pt states she has also had chills. Pt reports she does drink fluids but not water, states she lives with grandmother who doesn't cook.

## 2020-12-05 NOTE — ED Notes (Addendum)
Pt ambulatory from triage no assistance needed.

## 2020-12-05 NOTE — ED Provider Notes (Signed)
[facility] EMERGENCY DEPARTMENT Provider Note   CSN: [id number] Arrival date & time: 12/05/20  4098     History Chief Complaint  Patient presents with   Weakness    Megan George is a 30 y.o. female.  HPI Patient is a 30 year old female presented to the ER today with complaints of feeling fatigued and tired she states her symptoms been going on for approximately 1 month.  She states that she does not have much of an appetite and has been eating and drinking very little.  When asked about why she is eating lunch she states that her mother has not been cooking for her very regularly. She also states that she does not like drinking water because she does not like the taste.  She had a denies any nausea vomiting or diarrhea no fevers or chills cough congestion lightheadedness dizziness.  No syncope or near syncope.  She states she just feels generally weak and tired.  She denies any sick contacts.      Past Medical History:  Diagnosis Date   Marijuana abuse     Patient Active Problem List   Diagnosis Date Noted   Sports physical 08/29/2011   FOOT PAIN, RIGHT 01/30/2010    History reviewed. No pertinent surgical history.   OB History   No obstetric history on file.     Family History  Problem Relation Age of Onset   Sudden death Neg Hx    Heart attack Neg Hx     Social History   Tobacco Use   Smoking status: Some Days    Packs/day: 0.50    Types: Cigarettes   Smokeless tobacco: Never  Vaping Use   Vaping Use: Every day   Substances: Flavoring  Substance Use Topics   Alcohol use: No   Drug use: Yes    Types: Marijuana    Comment: opiates last week 'pills'    Home Medications Prior to Admission medications   Medication Sig Start Date End Date Taking? Authorizing Provider  ondansetron (ZOFRAN ODT) 4 MG disintegrating tablet Take 1 tablet (4 mg total) by mouth every 8 (eight) hours as needed for nausea or vomiting. 10/08/20   Roemhildt, Lorin T,  PA-C    Allergies    Patient has no known allergies.  Review of Systems   Review of Systems  Constitutional:  Positive for fatigue. Negative for chills and fever.  HENT:  Negative for congestion.   Eyes:  Negative for pain.  Respiratory:  Negative for cough and shortness of breath.   Cardiovascular:  Negative for chest pain and leg swelling.  Gastrointestinal:  Negative for abdominal pain and vomiting.  Genitourinary:  Negative for dysuria.  Musculoskeletal:  Negative for myalgias.  Skin:  Negative for rash.  Neurological:  Negative for dizziness and headaches.   Physical Exam Updated Vital Signs BP (!) 125/97 (BP Location: Left Arm)   Pulse 79   Temp 98.7 F (37.1 C) (Oral)   Resp 20   Ht 5\' 6"  (1.676 m)   Wt 57.5 kg   LMP 12/05/2020   SpO2 100%   BMI 20.46 kg/m   Physical Exam Vitals and nursing note reviewed.  Constitutional:      General: She is not in acute distress.    Comments: Pleasant well-appearing 30 year old.  In no acute distress.  Sitting comfortably in bed.  Able answer questions appropriately follow commands. No increased work of breathing. Speaking in full sentences.   HENT:     Head:  Normocephalic and atraumatic.     Nose: Nose normal.  Eyes:     General: No scleral icterus. Cardiovascular:     Rate and Rhythm: Normal rate and regular rhythm.     Pulses: Normal pulses.     Heart sounds: Normal heart sounds.  Pulmonary:     Effort: Pulmonary effort is normal. No respiratory distress.     Breath sounds: No wheezing.  Abdominal:     Palpations: Abdomen is soft.     Tenderness: There is no abdominal tenderness. There is no guarding or rebound.  Musculoskeletal:     Cervical back: Normal range of motion.     Right lower leg: No edema.     Left lower leg: No edema.  Skin:    General: Skin is warm and dry.     Capillary Refill: Capillary refill takes less than 2 seconds.  Neurological:     Mental Status: She is alert. Mental status is at  baseline.  Psychiatric:        Mood and Affect: Mood normal.        Behavior: Behavior normal.    ED Results / Procedures / Treatments   Labs (all labs ordered are listed, but only abnormal results are displayed) Labs Reviewed  COMPREHENSIVE METABOLIC PANEL - Abnormal; Notable for the following components:      Result Value   Potassium 3.4 (*)    Glucose, Bld 142 (*)    All other components within normal limits  CBC WITH DIFFERENTIAL/PLATELET - Abnormal; Notable for the following components:   MCV 77.2 (*)    All other components within normal limits    EKG None  Radiology No results found.  Procedures Procedures   Medications Ordered in ED Medications - No data to display  ED Course  I have reviewed the triage vital signs and the nursing notes.  Pertinent labs & imaging results that were available during my care of the patient were reviewed by me and considered in my medical decision making (see chart for details).    MDM Rules/Calculators/A&P                          Patient is a 30 year old female presented to ER today with general fatigue for 1 month or perhaps more.  Seems that she has not been eating or drinking but this seems to be due to personal preference.   Labs obtained which are unremarkable.  No significant electrolyte derangement apart from very mild hypokalemia.  CBC unremarkable.  No anemia.  No leukocytosis.  Physical exam unremarkable.  Patient is oromucosa is moist.  Patient does not appear to significantly dehydrated.  No nausea vomiting or diarrhea  We will discharge patient home at this time and recommend follow-up with PCP.  Return precautions given.  Final Clinical Impression(s) / ED Diagnoses Final diagnoses:  Other fatigue    Rx / DC Orders ED Discharge Orders     None        Gailen Shelter, [place] 12/05/20 1131    Milagros Loll, MD 12/05/20 2051

## 2021-03-23 ENCOUNTER — Encounter (HOSPITAL_BASED_OUTPATIENT_CLINIC_OR_DEPARTMENT_OTHER): Payer: Self-pay

## 2021-03-23 ENCOUNTER — Other Ambulatory Visit: Payer: Self-pay

## 2021-03-23 ENCOUNTER — Emergency Department (HOSPITAL_BASED_OUTPATIENT_CLINIC_OR_DEPARTMENT_OTHER)
Admission: EM | Admit: 2021-03-23 | Discharge: 2021-03-23 | Disposition: A | Discharge status: Home / Self Care | Payer: Self-pay | Attending: Emergency Medicine | Admitting: Emergency Medicine

## 2021-03-23 DIAGNOSIS — R21 Rash and other nonspecific skin eruption: Secondary | ICD-10-CM

## 2021-03-23 DIAGNOSIS — L509 Urticaria, unspecified: Secondary | ICD-10-CM | POA: Insufficient documentation

## 2021-03-23 MED ORDER — CETIRIZINE HCL 10 MG PO TABS: 10.0000 mg | ORAL_TABLET | Freq: Every day | ORAL | 0 refills | Status: AC

## 2021-03-23 MED ORDER — DIPHENHYDRAMINE-ZINC ACETATE 2-0.1 % EX CREA: 1.0000 "application " | TOPICAL_CREAM | Freq: Three times a day (TID) | CUTANEOUS | 0 refills | Status: AC | PRN

## 2021-03-23 NOTE — Discharge Instructions (Signed)
Use bendryl and zyrtec as prescribed   Please follow up with your primary care provider within 5-7 days for re-evaluation of your symptoms. If you do not have a primary care provider, information for a healthcare clinic has been provided for you to make arrangements for follow up care. Please return to the emergency department for any new or worsening symptoms.

## 2021-03-23 NOTE — ED Provider Notes (Signed)
[facility] EMERGENCY DEPARTMENT Provider Note   CSN: [id number] Arrival date & time: 03/23/21  1202     History  Chief Complaint  Patient presents with   Urticaria    Megan George is a 31 y.o. female.  HPI  31 year old female presents emergency department today for for evaluation of a rash.  Rash has been intermittent for the last week.  She has had there is areas of her body impacted.  Rash is raised and erythematous.  It is also pruritic.  Denies any new exposures such as new soaps detergents perfumes or lotions.  She did recently start a job about a month ago.  Denies any facial swelling or trouble breathing.  Home Medications Prior to Admission medications   Medication Sig Start Date End Date Taking? Authorizing Provider  cetirizine (ZYRTEC ALLERGY) 10 MG tablet Take 1 tablet (10 mg total) by mouth daily. 03/23/21  Yes Shahara Hartsfield S, PA-C  diphenhydrAMINE-zinc acetate (BENADRYL EXTRA STRENGTH) cream Apply 1 application topically 3 (three) times daily as needed for itching. 03/23/21  Yes Amani Marseille S, PA-C  ondansetron (ZOFRAN ODT) 4 MG disintegrating tablet Take 1 tablet (4 mg total) by mouth every 8 (eight) hours as needed for nausea or vomiting. 10/08/20   Roemhildt, Lorin T, PA-C      Allergies    Other    Review of Systems   Review of Systems See HPI for pertinent positives or negatives.   Physical Exam Updated Vital Signs BP 130/85 (BP Location: Left Arm)    Pulse 91    Temp 99 F (37.2 C) (Oral)    Resp 18    LMP 03/13/2021    SpO2 100%  Physical Exam Vitals and nursing note reviewed.  Constitutional:      General: She is not in acute distress.    Appearance: She is well-developed.  HENT:     Head: Normocephalic and atraumatic.     Mouth/Throat:     Comments: No angioedema, stridor. Normal phonation Eyes:     Conjunctiva/sclera: Conjunctivae normal.  Cardiovascular:     Rate and Rhythm: Normal rate and regular rhythm.  Pulmonary:      Effort: Pulmonary effort is normal.     Breath sounds: Normal breath sounds.  Musculoskeletal:        General: Normal range of motion.     Cervical back: Neck supple.     Comments: Urticaria noted to the chest and left side of the neck  Skin:    General: Skin is warm and dry.  Neurological:     Mental Status: She is alert.    ED Results / Procedures / Treatments   Labs (all labs ordered are listed, but only abnormal results are displayed) Labs Reviewed - No data to display  EKG None  Radiology No results found.  Procedures Procedures    Medications Ordered in ED Medications - No data to display  ED Course/ Medical Decision Making/ A&P                           Medical Decision Making  Rash consistent with urticaria. Patient denies any difficulty breathing or swallowing.  Pt has a patent airway without stridor and is handling secretions without difficulty; no angioedema. No blisters, no pustules, no warmth, no draining sinus tracts, no superficial abscesses, no bullous impetigo, no vesicles, no desquamation, no target lesions with dusky purpura or a central bulla. Not tender to touch.  No concern for superimposed infection. No concern for SJS, TEN, TSS, tick borne illness, syphilis or other life-threatening condition. Will discharge home with benadryl cream, zyrtec. Advised on plan for f/u and strict return precautions.    Final Clinical Impression(s) / ED Diagnoses Final diagnoses:  Rash    Rx / DC Orders ED Discharge Orders          Ordered    cetirizine (ZYRTEC ALLERGY) 10 MG tablet  Daily        03/23/21 1411    diphenhydrAMINE-zinc acetate (BENADRYL EXTRA STRENGTH) cream  3 times daily PRN        03/23/21 1411              Lourene Hoston S, PA-C 03/23/21 1412    Melene Plan, DO 03/23/21 1536

## 2021-03-23 NOTE — ED Triage Notes (Signed)
Pt c/o scattered hives x 1 week-NAD-steady gait

## 2021-06-01 ENCOUNTER — Other Ambulatory Visit (HOSPITAL_BASED_OUTPATIENT_CLINIC_OR_DEPARTMENT_OTHER): Payer: Self-pay

## 2021-06-01 ENCOUNTER — Emergency Department (HOSPITAL_BASED_OUTPATIENT_CLINIC_OR_DEPARTMENT_OTHER): Payer: Self-pay

## 2021-06-01 ENCOUNTER — Encounter (HOSPITAL_BASED_OUTPATIENT_CLINIC_OR_DEPARTMENT_OTHER): Payer: Self-pay | Admitting: Emergency Medicine

## 2021-06-01 ENCOUNTER — Emergency Department (HOSPITAL_BASED_OUTPATIENT_CLINIC_OR_DEPARTMENT_OTHER)
Admission: EM | Admit: 2021-06-01 | Discharge: 2021-06-01 | Disposition: A | Discharge status: Home / Self Care | Payer: Self-pay | Attending: Emergency Medicine | Admitting: Emergency Medicine

## 2021-06-01 ENCOUNTER — Other Ambulatory Visit: Payer: Self-pay

## 2021-06-01 DIAGNOSIS — M7021 Olecranon bursitis, right elbow: Secondary | ICD-10-CM

## 2021-06-01 DIAGNOSIS — S60221A Contusion of right hand, initial encounter: Secondary | ICD-10-CM

## 2021-06-01 DIAGNOSIS — W228XXA Striking against or struck by other objects, initial encounter: Secondary | ICD-10-CM | POA: Insufficient documentation

## 2021-06-01 DIAGNOSIS — Y9353 Activity, golf: Secondary | ICD-10-CM | POA: Insufficient documentation

## 2021-06-01 MED ORDER — MELOXICAM 15 MG PO TABS
15.0000 mg | ORAL_TABLET | Freq: Every day | ORAL | 0 refills | Status: AC
  Filled 2021-06-01: qty 14, 14d supply, fill #0

## 2021-06-01 NOTE — ED Triage Notes (Addendum)
Rt elbow swelling since yesterday golf ball size swelling denies injury , slightly red nd very tender to touch ?

## 2021-06-01 NOTE — Discharge Instructions (Signed)
You were seen in the emergency room today with swelling of the elbow.  This appears to be bursitis.  We are placing you in an Ace wrap which is there for comfort.  You may remove this for bathing and then reapply it to help with compression.  I have also called in some anti-inflammatory medications.  Please follow-up with the sports medicine doctor listed and return with any new or suddenly worsening symptoms. ?

## 2021-06-01 NOTE — ED Provider Notes (Signed)
? ?Emergency Department Provider Note ? ? ?I have reviewed the triage vital signs and the nursing notes. ? ? ?HISTORY ? ?Chief Complaint ?Joint Swelling ? ? ?HPI ?Megan George is a 31 y.o. female past history reviewed below presents emergency department with right elbow swelling with no known trauma.  Patient states that 2 nights ago she did punch a door no some swelling in the right arm but did not feel any pain into the elbow.  She was not fighting with another person.  She has not had fevers or redness to the area.  There is some swelling noted that has been gradually increasing.  She is able to move her elbow freely but has pain at the focal area of swelling. No drainage. No numbness.  ? ?Past Medical History:  ?Diagnosis Date  ? Marijuana abuse   ? ? ?Review of Systems ? ?Constitutional: No fever/chills ?Cardiovascular: Denies chest pain. ?Respiratory: Denies shortness of breath. ?Gastrointestinal: No abdominal pain.   ?Musculoskeletal: Positive right elbow and hand pain/swelling.  ?Skin: Negative for rash. No cellulitis or abscess.  ?Neurological: Negative for headaches, focal weakness or numbness. ? ? ?____________________________________________ ? ? ?PHYSICAL EXAM: ? ?VITAL SIGNS: ?ED Triage Vitals  ?Enc Vitals Group  ?   BP 06/01/21 0831 127/86  ?   Pulse Rate 06/01/21 0831 90  ?   Resp 06/01/21 0831 17  ?   Temp 06/01/21 0831 99.3 ?F (37.4 ?C)  ?   Temp Source 06/01/21 0831 Oral  ?   SpO2 06/01/21 0831 100 %  ?   Weight 06/01/21 0831 125 lb (56.7 kg)  ?   Height 06/01/21 0831 5\' 6"  (1.676 m)  ? ?Constitutional: Alert and oriented. Well appearing and in no acute distress. ?Eyes: Conjunctivae are normal.  ?Head: Atraumatic. ?Nose: No congestion/rhinnorhea. ?Mouth/Throat: Mucous membranes are moist.   ?Neck: No stridor.   ?Cardiovascular: Normal rate, regular rhythm. Good peripheral circulation. Grossly normal heart sounds.   ?Respiratory: Normal respiratory effort.  No retractions. Lungs  CTAB. ?Gastrointestinal: Soft and nontender. No distention.  ?Musculoskeletal: Focal area of swelling over the olecranon bursa without erythema or surrounding cellulitis.  Normal range of motion of the right elbow.  Mild tenderness to the lateral aspect of the right hand with no deformity.  No wrist tenderness. No scaphoid tenderness.  ?Neurologic:  Normal speech and language. No gross focal neurologic deficits are appreciated.  ?Skin:  Skin is warm, dry and intact. No rash noted. ? ?____________________________________________ ? ?RADIOLOGY ? ?DG Elbow Complete Right ? ?Result Date: 06/01/2021 ?CLINICAL DATA:  Elbow pain since yesterday swelling EXAM: RIGHT ELBOW - COMPLETE 3+ VIEW COMPARISON:  None. FINDINGS: There is no acute fracture or dislocation. Elbow alignment is maintained. Joint spaces are preserved. There is soft tissue swelling over the olecranon. No soft tissue gas or radiopaque foreign body is seen. IMPRESSION: Soft tissue swelling over the olecranon suggesting olecranon bursitis. Electronically Signed   By: 06/03/2021 M.D.   On: 06/01/2021 09:08  ? ?DG Hand Complete Right ? ?Result Date: 06/01/2021 ?CLINICAL DATA:  Right hand pain without known injury. EXAM: RIGHT HAND - COMPLETE 3+ VIEW COMPARISON:  None. FINDINGS: There is no evidence of fracture or dislocation. There is no evidence of arthropathy or other focal bone abnormality. Soft tissues are unremarkable. IMPRESSION: Negative. Electronically Signed   By: 06/03/2021 M.D.   On: 06/01/2021 09:07   ? ?____________________________________________ ? ? ?PROCEDURES ? ?Procedure(s) performed:  ? ?Procedures ? ?None ?____________________________________________ ? ? ?  INITIAL IMPRESSION / ASSESSMENT AND PLAN / ED COURSE ? ?Pertinent labs & imaging results that were available during my care of the patient were reviewed by me and considered in my medical decision making (see chart for details). ?  ?This patient is Presenting for Evaluation of elbow  pain/hand pain, which does require a range of treatment options, and is a complaint that involves a high risk of morbidity and mortality. ? ?The Differential Diagnoses include fracture, dislocation, bursitis, septic joint. ? ? ?Radiologic Tests Ordered, included plain films of elbow and wrist. I independently interpreted the images and agree with radiology interpretation.  ? ?Medical Decision Making: Summary:  ?Patient presents emergency department with elbow pain and swelling.  Her exam is most consistent with a nonseptic bursitis of the olecranon.  Exceedingly low suspicion for septic joint.  Will obtain plain films given some report of trauma room near the time of pain.  ? ?Reevaluation with update and discussion with patient. Discussed RICE mgmt and rest. Discussed antiinflammatory meds. Contact info given for sports med for follow up.  ? ?Disposition: discharge ? ?____________________________________________ ? ?FINAL CLINICAL IMPRESSION(S) / ED DIAGNOSES ? ?Final diagnoses:  ?Olecranon bursitis of right elbow  ?Contusion of right hand, initial encounter  ? ? ? ?NEW OUTPATIENT MEDICATIONS STARTED DURING THIS VISIT: ? ?Discharge Medication List as of 06/01/2021  9:13 AM  ?  ? ?START taking these medications  ? Details  ?meloxicam (MOBIC) 15 MG tablet Take 1 tablet (15 mg total) by mouth daily for 14 days., Starting Fri 06/01/2021, Until Fri 06/15/2021, Normal  ?  ?  ? ? ?Note:  This document was prepared using Dragon voice recognition software and may include unintentional dictation errors. ? ?Alona Bene, MD, FACEP ?Emergency Medicine ? ?  ?Maia Plan, MD ?06/01/21 1017 ? ?

## 2021-06-01 NOTE — ED Notes (Signed)
Ace wrap applied to right elbow, Pt tolerated well. ?

## 2022-11-18 IMAGING — DX DG ELBOW COMPLETE 3+V*R*
4 series · 4 of 4 positions shown · non-contrast
Comparison: None.

CLINICAL DATA: Elbow pain since yesterday swelling

EXAM:
RIGHT ELBOW - COMPLETE 3+ VIEW

[elbow ap]
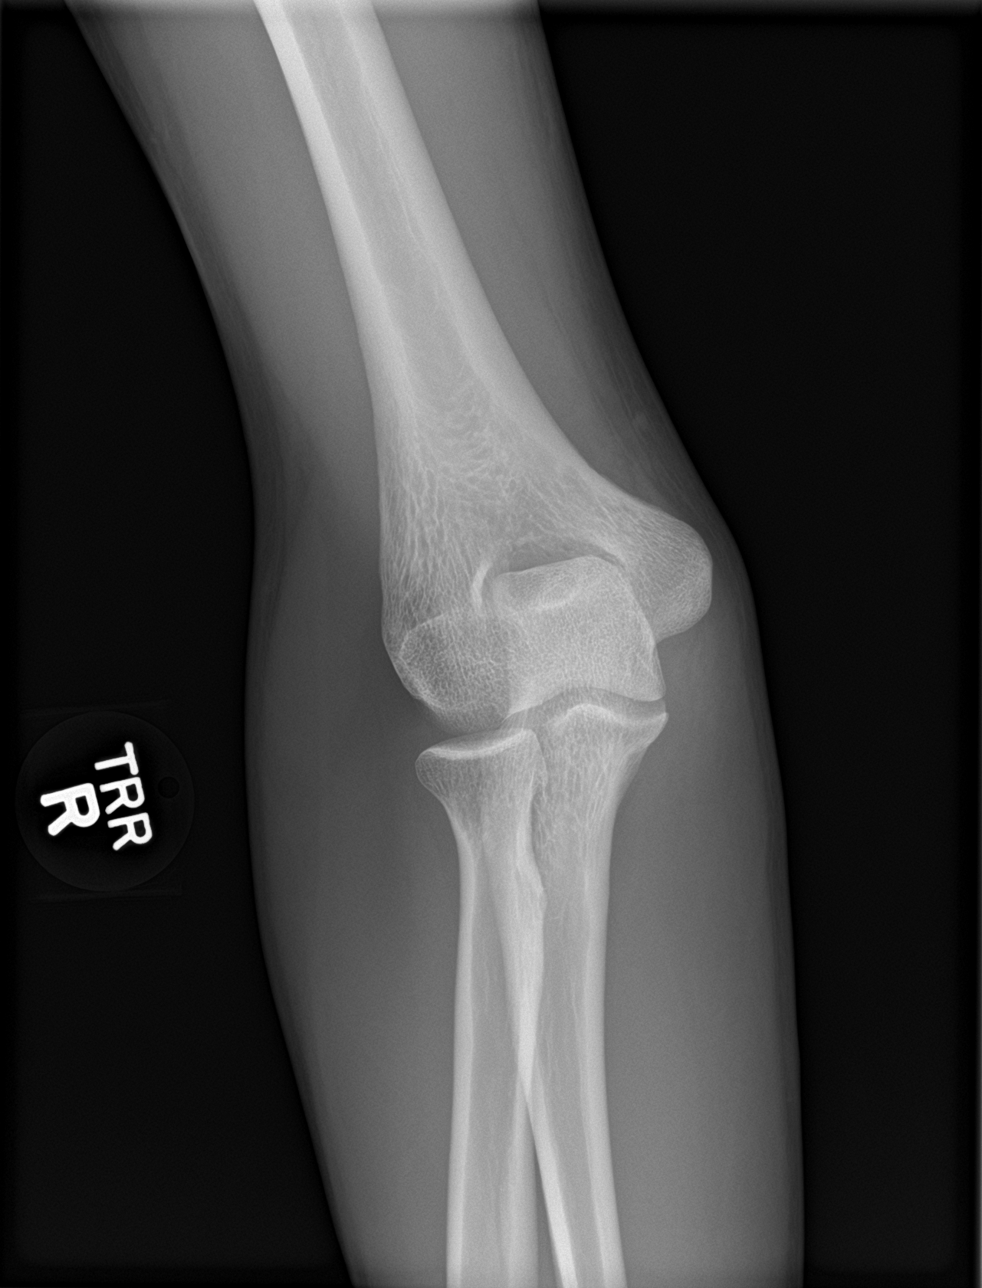

[elbow obl (1 of 2)]
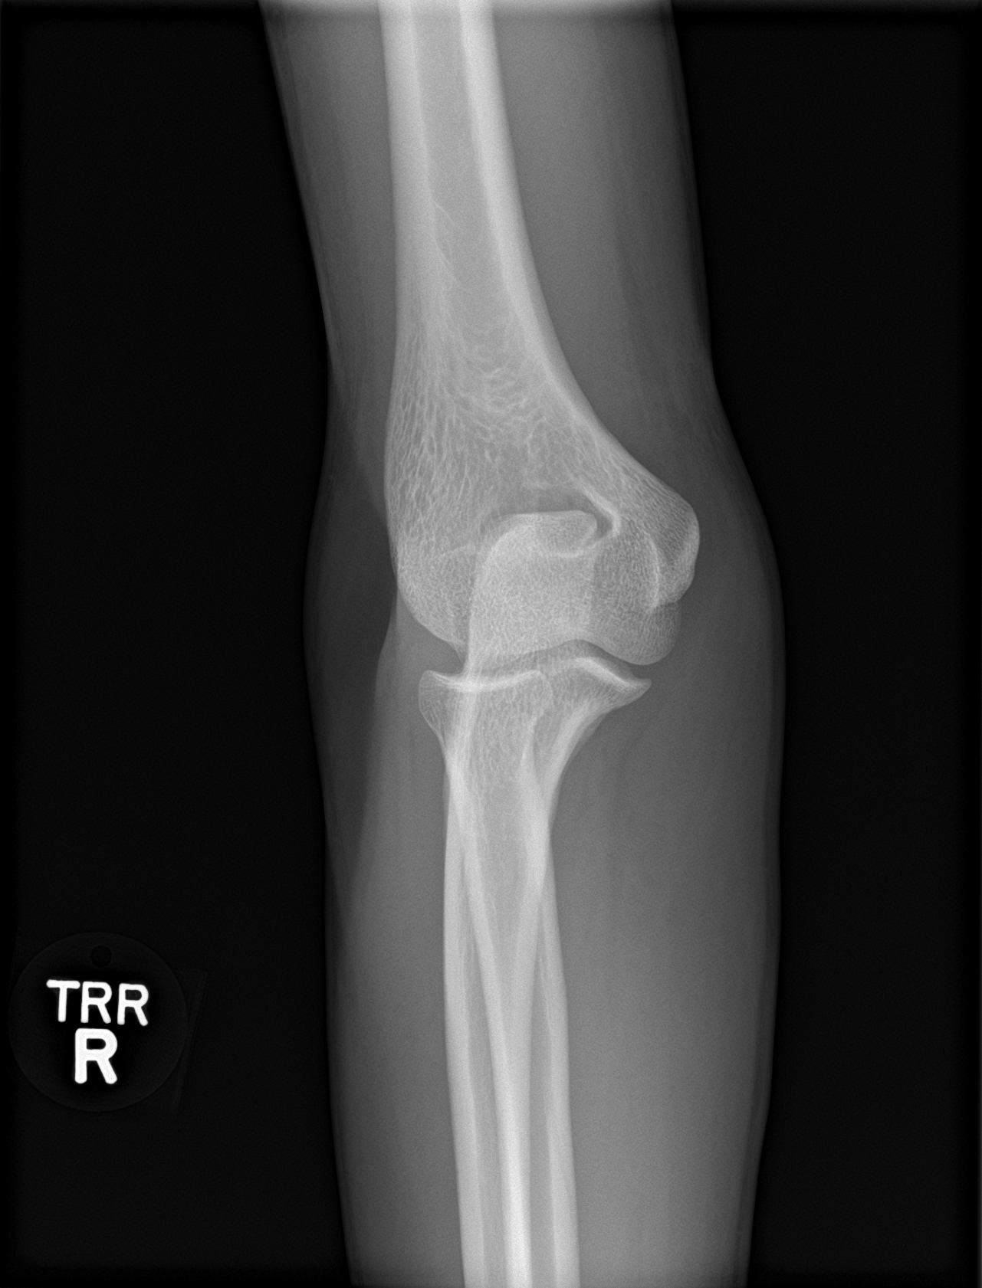

[elbow obl (2 of 2)]
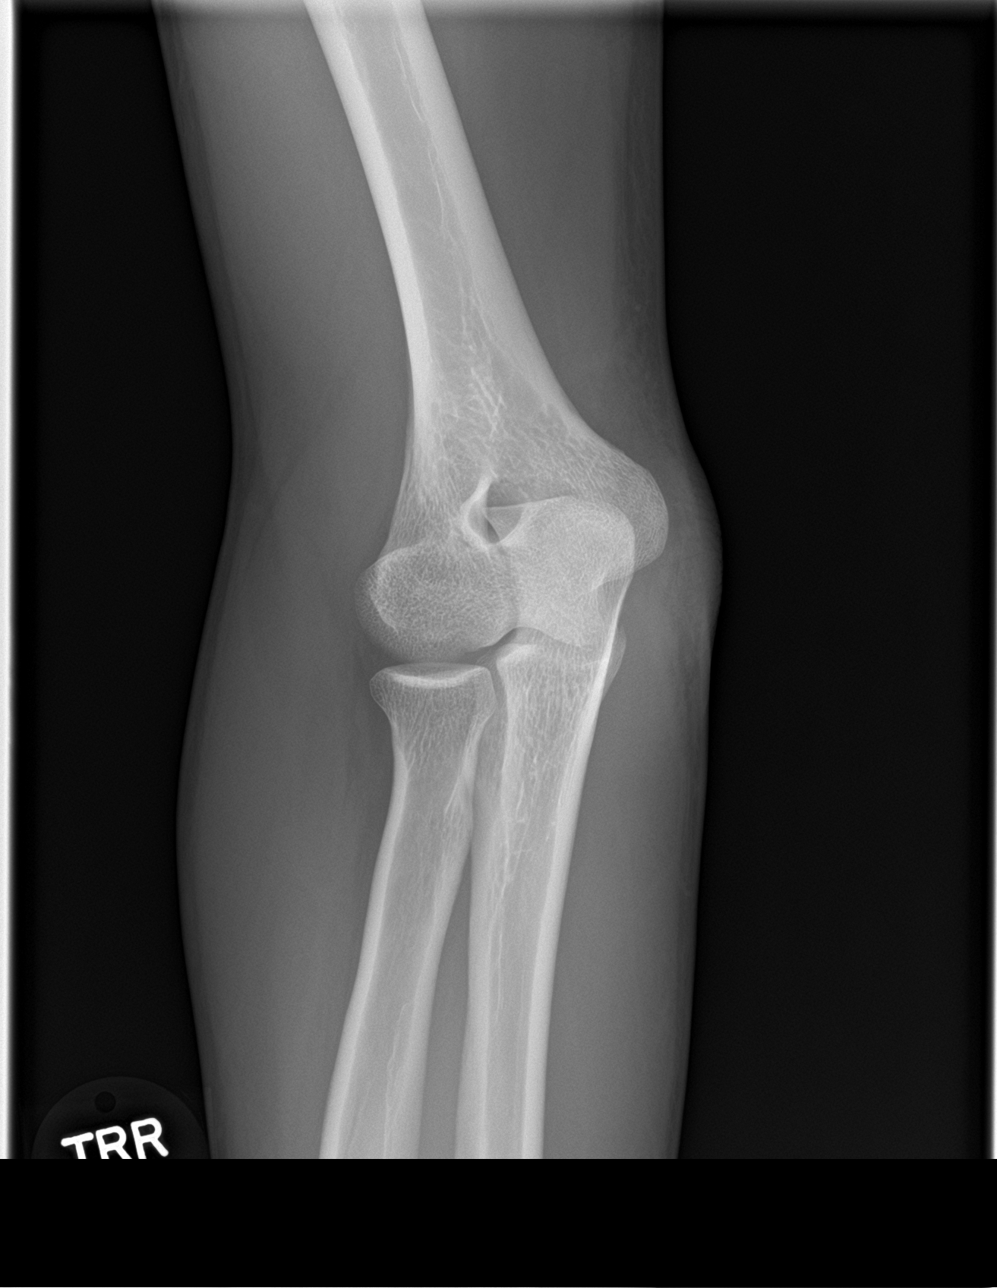

[elbow lat]
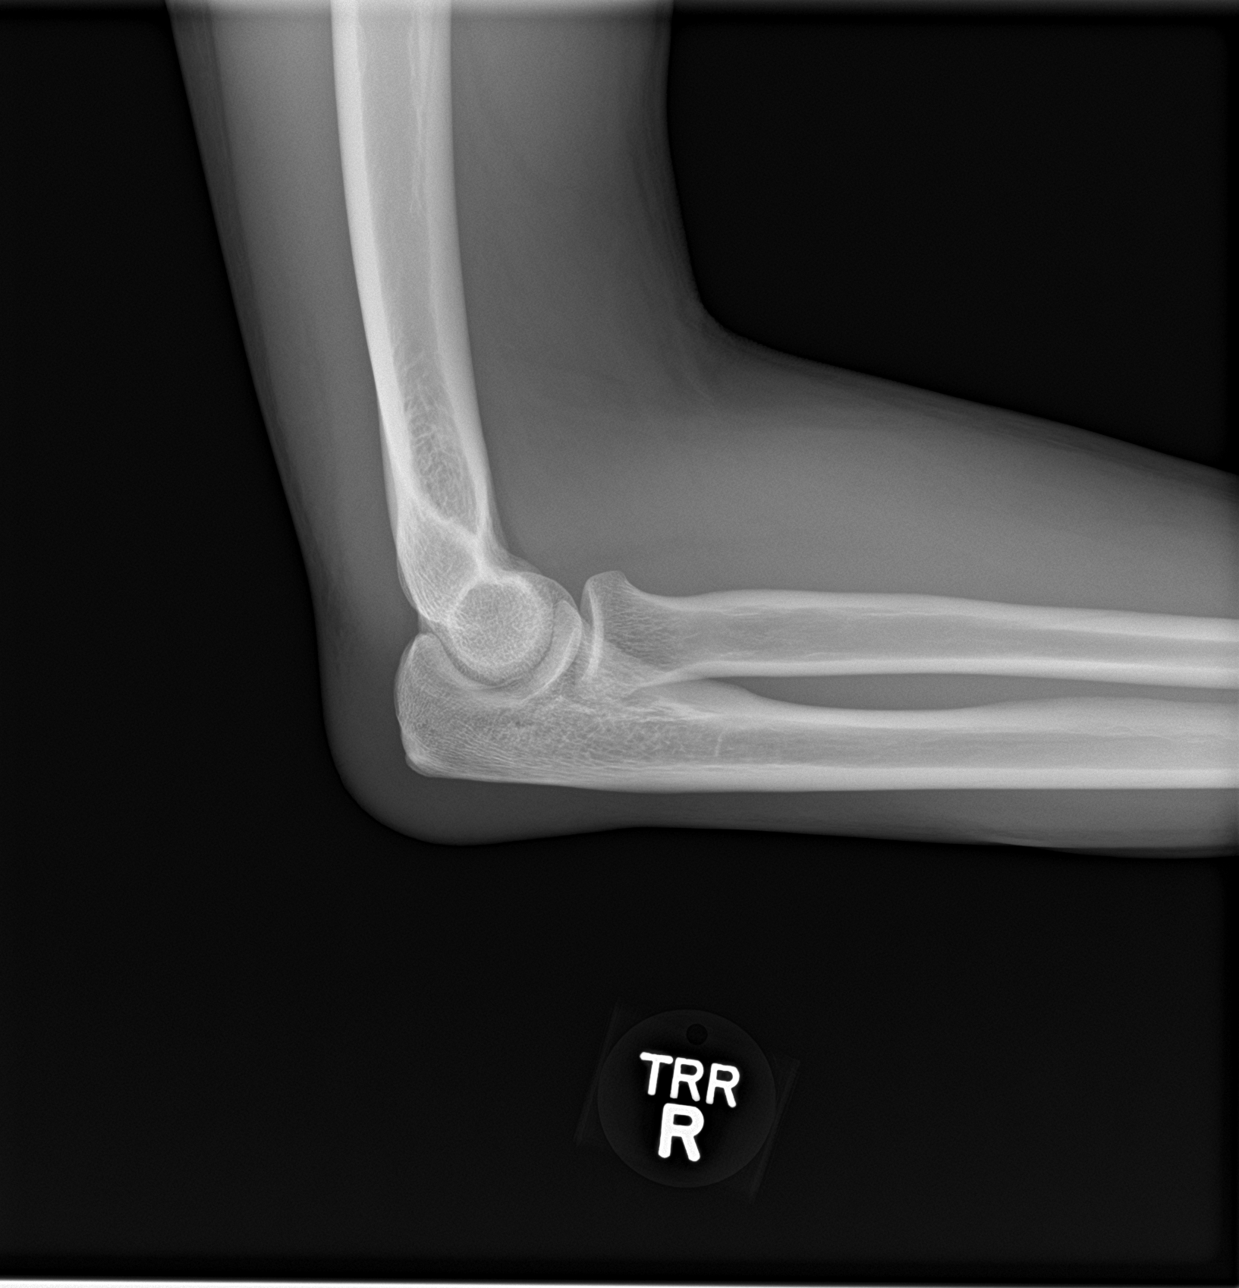

[4 of 4 positions shown; findings below may reference images not displayed]

FINDINGS: There is no acute fracture or dislocation. Elbow alignment is
maintained. Joint spaces are preserved. There is soft tissue
swelling over the olecranon. No soft tissue gas or radiopaque
foreign body is seen.
IMPRESSION: Soft tissue swelling over the olecranon suggesting olecranon
bursitis.

## 2024-01-23 ENCOUNTER — Other Ambulatory Visit (HOSPITAL_COMMUNITY): Payer: Self-pay
